# Patient Record
Sex: Male | Born: 1960 | Race: Black or African American | Hispanic: No | Marital: Single | Smoking: Current every day smoker
Health system: Southern US, Community
[De-identification: ages and names within clinical notes are randomized; demographics above are authoritative.]

## PROBLEM LIST (undated history)

## (undated) DIAGNOSIS — J449 Chronic obstructive pulmonary disease, unspecified: Secondary | ICD-10-CM

## (undated) DIAGNOSIS — M109 Gout, unspecified: Secondary | ICD-10-CM

## (undated) DIAGNOSIS — E119 Type 2 diabetes mellitus without complications: Secondary | ICD-10-CM

## (undated) DIAGNOSIS — N189 Chronic kidney disease, unspecified: Secondary | ICD-10-CM

## (undated) DIAGNOSIS — I10 Essential (primary) hypertension: Secondary | ICD-10-CM

## (undated) DIAGNOSIS — I2510 Atherosclerotic heart disease of native coronary artery without angina pectoris: Secondary | ICD-10-CM

## (undated) DIAGNOSIS — K759 Inflammatory liver disease, unspecified: Secondary | ICD-10-CM

## (undated) HISTORY — PX: ABOVE KNEE LEG AMPUTATION: SUR20

## (undated) HISTORY — PX: VASCULAR SURGERY: SHX849

## (undated) HISTORY — DX: Chronic kidney disease, unspecified: N18.9

## (undated) HISTORY — PX: SKIN GRAFT: SHX250

## (undated) HISTORY — DX: Inflammatory liver disease, unspecified: K75.9

---

## 2007-10-11 ENCOUNTER — Emergency Department: Payer: Self-pay | Admitting: Emergency Medicine

## 2007-11-24 ENCOUNTER — Emergency Department: Payer: Self-pay | Admitting: Emergency Medicine

## 2008-11-20 IMAGING — CR DG CHEST 2V
1 series · 2 of 2 positions shown · non-contrast
Comparison: none

REASON FOR EXAM: cough, fever, sob
COMMENTS:   LMP: male

[Series 1: view not recorded · 0.17mm/px · 2 of 2 slices shown]
[im 1/2]
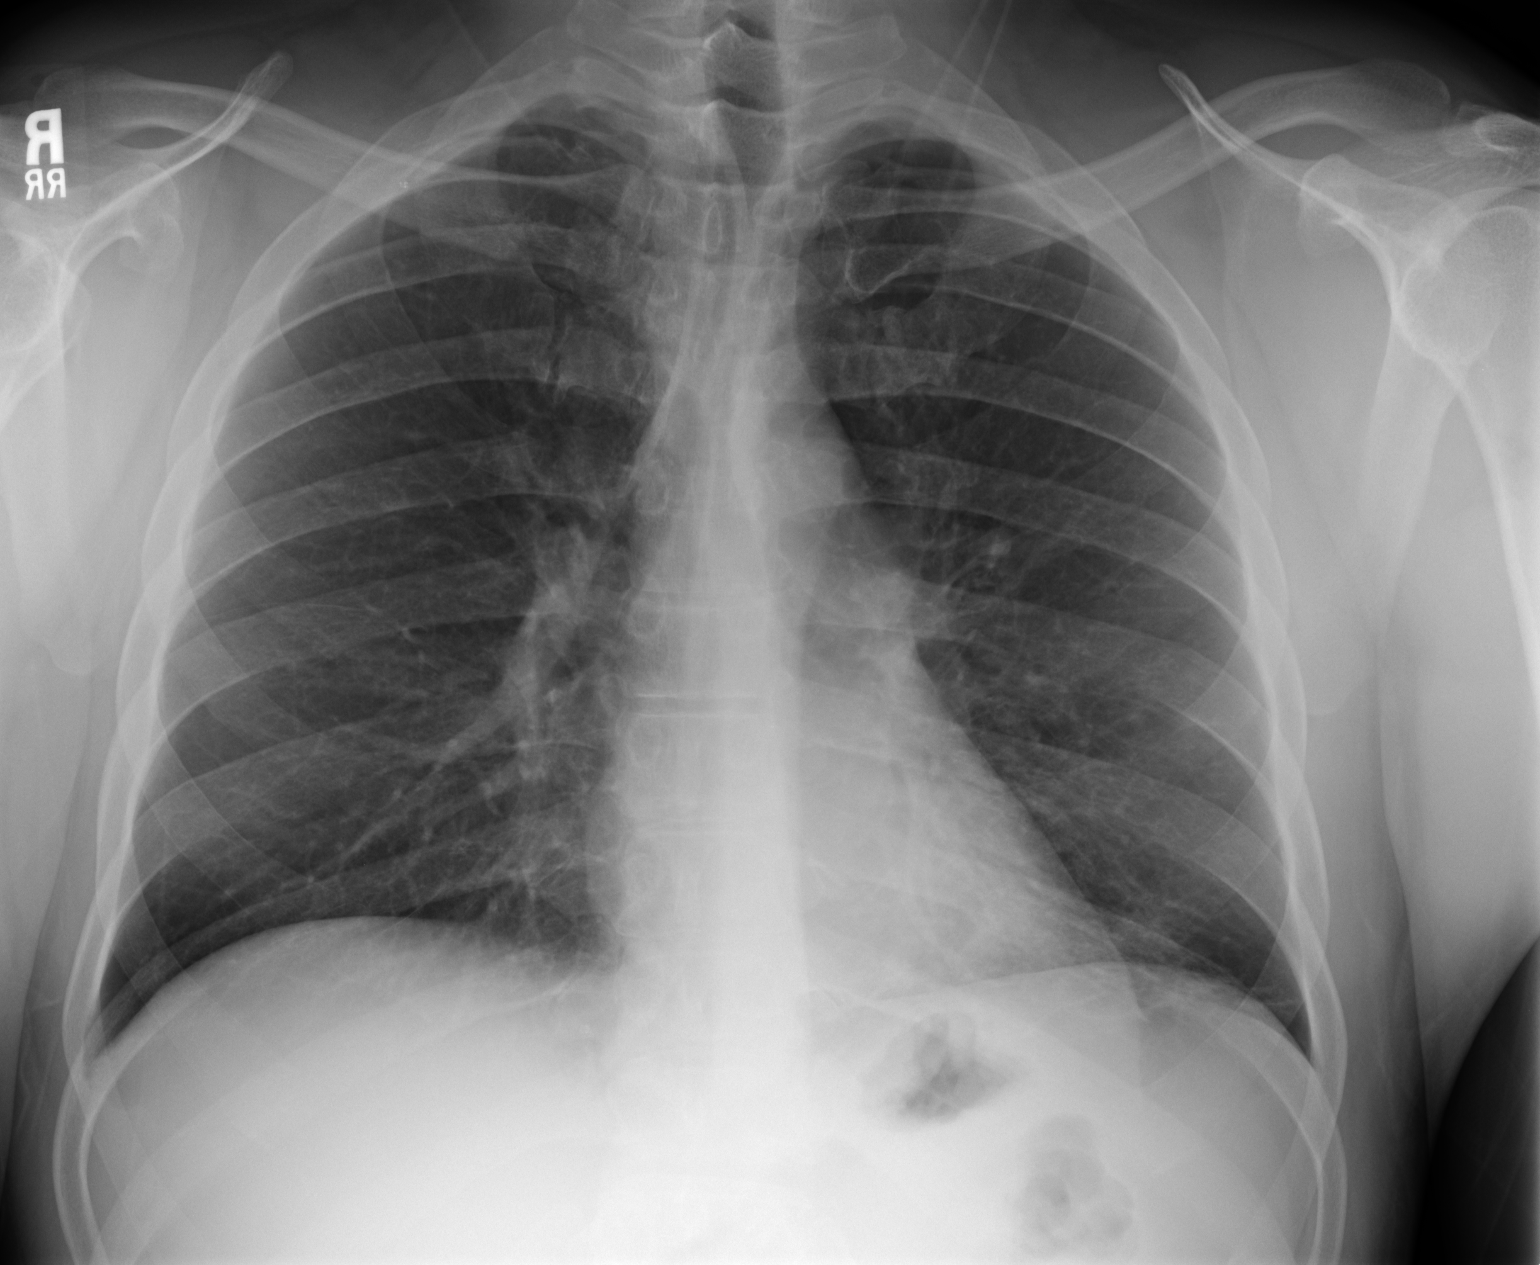
[im 2/2]
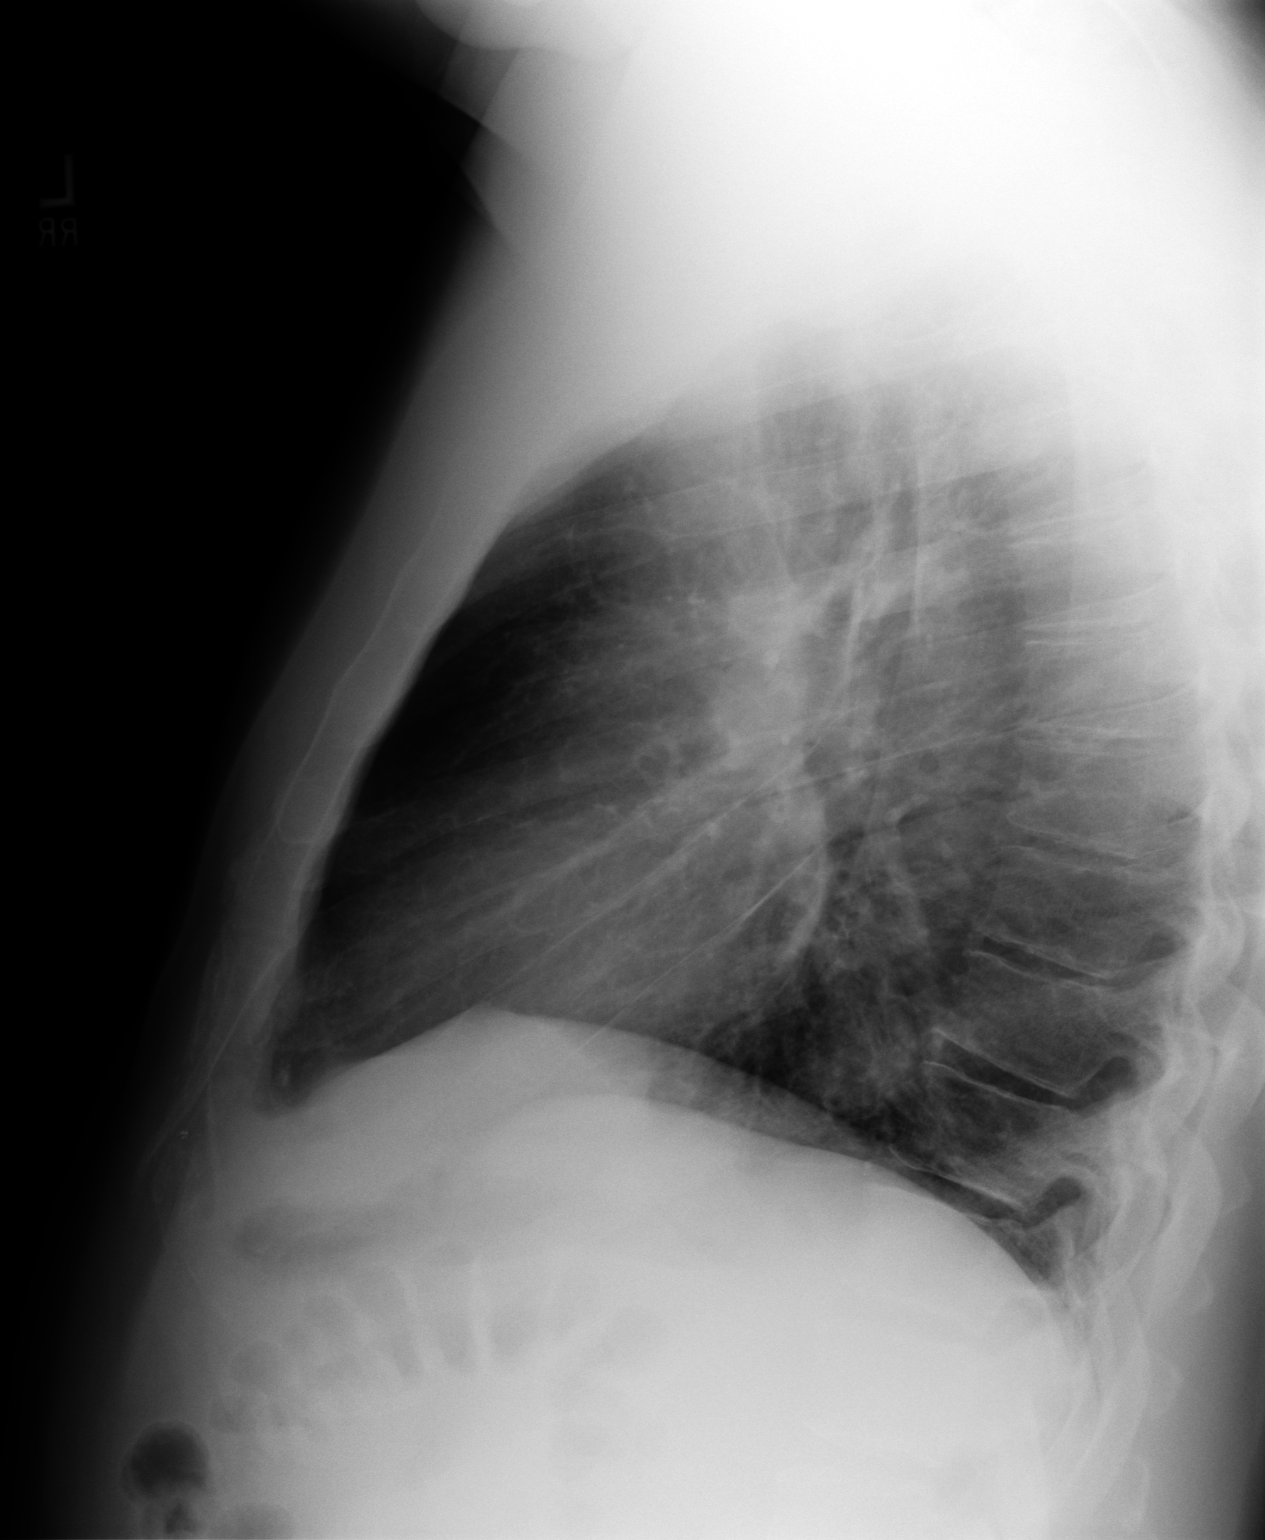

[2 of 2 positions shown; findings below may reference images not displayed]

PROCEDURE:     DXR - DXR CHEST PA (OR AP) AND LATERAL  - October 11, 2007  [DATE]

RESULT:     The patient is complaining of cough and fever and chest
discomfort.

The lungs are adequately inflated. I see no focal infiltrate or evidence of
a pleural effusion. There is no pneumothorax. The heart and mediastinal
structures are within limits of normal. The perihilar lung markings are
minimally prominent. I do not see evidence of a displaced rib fracture.
IMPRESSION: I cannot exclude an element of acute bronchitis but I do not see evidence of
pneumonia. Followup films are recommended if the patient has persistent
symptoms.

## 2012-10-14 ENCOUNTER — Emergency Department: Payer: Self-pay | Admitting: Emergency Medicine

## 2012-10-14 LAB — BASIC METABOLIC PANEL
BUN: 10 mg/dL (ref 7–18)
Chloride: 99 mmol/L (ref 98–107)
Co2: 22 mmol/L (ref 21–32)
Creatinine: 1.47 mg/dL — ABNORMAL HIGH (ref 0.60–1.30)
EGFR (Non-African Amer.): 55 — ABNORMAL LOW
Glucose: 102 mg/dL — ABNORMAL HIGH (ref 65–99)
Potassium: 3.3 mmol/L — ABNORMAL LOW (ref 3.5–5.1)
Sodium: 135 mmol/L — ABNORMAL LOW (ref 136–145)

## 2012-10-14 LAB — CK TOTAL AND CKMB (NOT AT ARMC): CK-MB: 0.6 ng/mL (ref 0.5–3.6)

## 2012-10-14 LAB — CBC
HCT: 48.8 % (ref 40.0–52.0)
HGB: 17.1 g/dL (ref 13.0–18.0)
MCHC: 35 g/dL (ref 32.0–36.0)
RBC: 5.58 10*6/uL (ref 4.40–5.90)
RDW: 13.6 % (ref 11.5–14.5)
WBC: 12 10*3/uL — ABNORMAL HIGH (ref 3.8–10.6)

## 2012-10-14 LAB — RAPID INFLUENZA A&B ANTIGENS

## 2012-10-14 LAB — TROPONIN I: Troponin-I: <0.02 ng/mL

## 2013-05-06 ENCOUNTER — Emergency Department: Payer: Self-pay | Admitting: Emergency Medicine

## 2013-05-06 LAB — CBC
HCT: 40 % (ref 40.0–52.0)
HGB: 13.9 g/dL (ref 13.0–18.0)
MCH: 29.2 pg (ref 26.0–34.0)
MCHC: 34.6 g/dL (ref 32.0–36.0)

## 2013-05-06 LAB — COMPREHENSIVE METABOLIC PANEL
Albumin: 3.2 g/dL — ABNORMAL LOW (ref 3.4–5.0)
Alkaline Phosphatase: 160 U/L — ABNORMAL HIGH (ref 50–136)
BUN: 14 mg/dL (ref 7–18)
Bilirubin,Total: 1.1 mg/dL — ABNORMAL HIGH (ref 0.2–1.0)
Calcium, Total: 9.8 mg/dL (ref 8.5–10.1)
Chloride: 98 mmol/L (ref 98–107)
EGFR (African American): 56 — ABNORMAL LOW
EGFR (Non-African Amer.): 48 — ABNORMAL LOW
Glucose: 98 mg/dL (ref 65–99)
Osmolality: 265 (ref 275–301)
SGOT(AST): 26 U/L (ref 15–37)

## 2013-05-06 LAB — URINALYSIS, COMPLETE
Bacteria: NONE SEEN
Blood: NEGATIVE
Glucose,UR: NEGATIVE mg/dL (ref 0–75)
Leukocyte Esterase: NEGATIVE
RBC,UR: <1 /HPF (ref 0–5)
Specific Gravity: 1.021 (ref 1.003–1.030)
Squamous Epithelial: NONE SEEN

## 2014-09-24 ENCOUNTER — Emergency Department: Payer: Self-pay | Admitting: Emergency Medicine

## 2014-09-24 LAB — URINALYSIS, COMPLETE
BILIRUBIN, UR: NEGATIVE
Bacteria: NONE SEEN
Blood: NEGATIVE
GLUCOSE, UR: NEGATIVE mg/dL (ref 0–75)
Hyaline Cast: 6
KETONE: NEGATIVE
LEUKOCYTE ESTERASE: NEGATIVE
Nitrite: NEGATIVE
Ph: 5 (ref 4.5–8.0)
Protein: NEGATIVE
RBC,UR: 1 /HPF (ref 0–5)
Specific Gravity: 1.006 (ref 1.003–1.030)

## 2014-09-24 LAB — COMPREHENSIVE METABOLIC PANEL
ALK PHOS: 95 U/L
ALT: 88 U/L — AB
Albumin: 3.8 g/dL (ref 3.4–5.0)
Anion Gap: 10 (ref 7–16)
BILIRUBIN TOTAL: 0.4 mg/dL (ref 0.2–1.0)
BUN: 12 mg/dL (ref 7–18)
CO2: 28 mmol/L (ref 21–32)
Calcium, Total: 8.5 mg/dL (ref 8.5–10.1)
Chloride: 104 mmol/L (ref 98–107)
Creatinine: 1.39 mg/dL — ABNORMAL HIGH (ref 0.60–1.30)
EGFR (Non-African Amer.): 57 — ABNORMAL LOW
Glucose: 154 mg/dL — ABNORMAL HIGH (ref 65–99)
OSMOLALITY: 286 (ref 275–301)
Potassium: 3.9 mmol/L (ref 3.5–5.1)
SGOT(AST): 68 U/L — ABNORMAL HIGH (ref 15–37)
Sodium: 142 mmol/L (ref 136–145)
Total Protein: 8 g/dL (ref 6.4–8.2)

## 2014-09-24 LAB — CBC WITH DIFFERENTIAL/PLATELET
BASOS ABS: 2 %
Comment - H1-Com2: NORMAL
EOS PCT: 9 %
HCT: 49.4 % (ref 40.0–52.0)
HGB: 16.7 g/dL (ref 13.0–18.0)
LYMPHS PCT: 54 %
MCH: 30.1 pg (ref 26.0–34.0)
MCHC: 33.8 g/dL (ref 32.0–36.0)
MCV: 89 fL (ref 80–100)
MONOS PCT: 5 %
PLATELETS: 198 10*3/uL (ref 150–440)
RBC: 5.54 10*6/uL (ref 4.40–5.90)
RDW: 14.3 % (ref 11.5–14.5)
Segmented Neutrophils: 17 %
VARIANT LYMPHOCYTE - H1-RLYMPH: 13 %
WBC: 8 10*3/uL (ref 3.8–10.6)

## 2014-09-24 LAB — LIPASE, BLOOD: LIPASE: 415 U/L — AB (ref 73–393)

## 2015-06-27 ENCOUNTER — Ambulatory Visit: Payer: Self-pay

## 2015-07-13 ENCOUNTER — Ambulatory Visit: Payer: Self-pay

## 2015-07-25 ENCOUNTER — Ambulatory Visit: Payer: Self-pay

## 2015-07-27 ENCOUNTER — Ambulatory Visit: Payer: Self-pay

## 2015-07-27 DIAGNOSIS — N189 Chronic kidney disease, unspecified: Secondary | ICD-10-CM

## 2015-07-27 HISTORY — DX: Chronic kidney disease, unspecified: N18.9

## 2015-07-27 LAB — HEPATIC FUNCTION PANEL
ALT: 46 U/L — AB (ref 10–40)
AST: 39 U/L (ref 14–40)
Alkaline Phosphatase: 93 U/L (ref 25–125)
BILIRUBIN, TOTAL: 0.8 mg/dL

## 2015-07-27 LAB — BASIC METABOLIC PANEL
BUN: 17 mg/dL (ref 4–21)
Creatinine: 1.4 mg/dL — AB (ref 0.6–1.3)
Glucose: 96 mg/dL
SODIUM: 137 mmol/L (ref 137–147)

## 2015-08-10 ENCOUNTER — Other Ambulatory Visit: Payer: Self-pay

## 2015-11-02 ENCOUNTER — Ambulatory Visit: Payer: Self-pay

## 2015-11-16 ENCOUNTER — Ambulatory Visit: Payer: Self-pay

## 2015-11-28 ENCOUNTER — Ambulatory Visit: Payer: Self-pay

## 2015-11-28 DIAGNOSIS — R768 Other specified abnormal immunological findings in serum: Secondary | ICD-10-CM | POA: Insufficient documentation

## 2015-11-28 DIAGNOSIS — K759 Inflammatory liver disease, unspecified: Secondary | ICD-10-CM

## 2015-11-28 DIAGNOSIS — R7303 Prediabetes: Secondary | ICD-10-CM | POA: Insufficient documentation

## 2015-11-28 HISTORY — DX: Inflammatory liver disease, unspecified: K75.9

## 2015-11-30 DIAGNOSIS — R7303 Prediabetes: Secondary | ICD-10-CM

## 2015-11-30 DIAGNOSIS — R768 Other specified abnormal immunological findings in serum: Secondary | ICD-10-CM

## 2015-12-05 ENCOUNTER — Other Ambulatory Visit: Payer: Self-pay

## 2015-12-12 ENCOUNTER — Other Ambulatory Visit: Payer: Self-pay

## 2015-12-26 ENCOUNTER — Ambulatory Visit: Payer: Self-pay

## 2018-12-07 ENCOUNTER — Other Ambulatory Visit: Payer: Self-pay

## 2018-12-07 ENCOUNTER — Encounter: Payer: Self-pay | Admitting: Emergency Medicine

## 2018-12-07 DIAGNOSIS — F17210 Nicotine dependence, cigarettes, uncomplicated: Secondary | ICD-10-CM | POA: Insufficient documentation

## 2018-12-07 DIAGNOSIS — R1084 Generalized abdominal pain: Secondary | ICD-10-CM | POA: Insufficient documentation

## 2018-12-07 DIAGNOSIS — R140 Abdominal distension (gaseous): Secondary | ICD-10-CM | POA: Insufficient documentation

## 2018-12-07 DIAGNOSIS — K5900 Constipation, unspecified: Secondary | ICD-10-CM | POA: Insufficient documentation

## 2018-12-07 LAB — COMPREHENSIVE METABOLIC PANEL
ALT: 86 U/L — ABNORMAL HIGH (ref 0–44)
ANION GAP: 11 (ref 5–15)
AST: 79 U/L — ABNORMAL HIGH (ref 15–41)
Albumin: 4 g/dL (ref 3.5–5.0)
Alkaline Phosphatase: 72 U/L (ref 38–126)
BUN: 12 mg/dL (ref 6–20)
CHLORIDE: 101 mmol/L (ref 98–111)
CO2: 21 mmol/L — ABNORMAL LOW (ref 22–32)
Calcium: 8.5 mg/dL — ABNORMAL LOW (ref 8.9–10.3)
Creatinine, Ser: 1.14 mg/dL (ref 0.61–1.24)
GFR calc Af Amer: >60 mL/min (ref >60)
GFR calc non Af Amer: >60 mL/min (ref >60)
Glucose, Bld: 235 mg/dL — ABNORMAL HIGH (ref 70–99)
Potassium: 3.6 mmol/L (ref 3.5–5.1)
Sodium: 133 mmol/L — ABNORMAL LOW (ref 135–145)
Total Bilirubin: 0.9 mg/dL (ref 0.3–1.2)
Total Protein: 7.2 g/dL (ref 6.5–8.1)

## 2018-12-07 LAB — CBC
HCT: 46.6 % (ref 39.0–52.0)
Hemoglobin: 16.2 g/dL (ref 13.0–17.0)
MCH: 30.5 pg (ref 26.0–34.0)
MCHC: 34.8 g/dL (ref 30.0–36.0)
MCV: 87.6 fL (ref 80.0–100.0)
Platelets: 167 10*3/uL (ref 150–400)
RBC: 5.32 MIL/uL (ref 4.22–5.81)
RDW: 13 % (ref 11.5–15.5)
WBC: 14.2 10*3/uL — ABNORMAL HIGH (ref 4.0–10.5)
nRBC: 0 % (ref 0.0–0.2)

## 2018-12-07 LAB — LIPASE, BLOOD: LIPASE: 31 U/L (ref 11–51)

## 2018-12-07 MED ORDER — SODIUM CHLORIDE 0.9% FLUSH: 3.0000 mL | Freq: Once | INTRAVENOUS | Status: DC

## 2018-12-07 NOTE — ED Triage Notes (Addendum)
Pt presents to ED via [facility] with c/o abdominal pain x 3 months. Per EMS pt found out approx 1 month ago that patient had Hep C. Per EMS pt with abdominal distention, VSS. Pt denies N/V/D.

## 2018-12-07 NOTE — ED Notes (Signed)
Pt states he does not want his medical hx discussed in front of family.

## 2018-12-08 ENCOUNTER — Encounter: Payer: Self-pay | Admitting: Radiology

## 2018-12-08 ENCOUNTER — Emergency Department: Payer: Self-pay

## 2018-12-08 ENCOUNTER — Emergency Department
Admission: EM | Admit: 2018-12-08 | Discharge: 2018-12-08 | Disposition: A | Discharge status: Home / Self Care | Payer: Self-pay | Attending: Emergency Medicine | Admitting: Emergency Medicine

## 2018-12-08 DIAGNOSIS — R1084 Generalized abdominal pain: Secondary | ICD-10-CM

## 2018-12-08 DIAGNOSIS — K5900 Constipation, unspecified: Secondary | ICD-10-CM

## 2018-12-08 LAB — ETHANOL: ALCOHOL ETHYL (B): 168 mg/dL — AB (ref <10)

## 2018-12-08 LAB — URINALYSIS, COMPLETE (UACMP) WITH MICROSCOPIC
Bacteria, UA: NONE SEEN
Bilirubin Urine: NEGATIVE
Glucose, UA: NEGATIVE mg/dL
Hgb urine dipstick: NEGATIVE
KETONES UR: NEGATIVE mg/dL
LEUKOCYTE UA: NEGATIVE
Nitrite: NEGATIVE
Protein, ur: NEGATIVE mg/dL
Specific Gravity, Urine: 1.002 — ABNORMAL LOW (ref 1.005–1.030)
Squamous Epithelial / HPF: NONE SEEN (ref 0–5)
pH: 5 (ref 5.0–8.0)

## 2018-12-08 MED ORDER — LACTULOSE 10 GM/15ML PO SOLN: 20.0000 g | Freq: Every day | ORAL | 0 refills | Status: DC | PRN

## 2018-12-08 MED ORDER — FENTANYL CITRATE (PF) 100 MCG/2ML IJ SOLN
50.0000 ug | Freq: Once | INTRAMUSCULAR | Status: AC
  Administered 2018-12-08: 50 ug via INTRAVENOUS
  Filled 2018-12-08: qty 2

## 2018-12-08 MED ORDER — IOHEXOL 300 MG/ML  SOLN
100.0000 mL | Freq: Once | INTRAMUSCULAR | Status: AC | PRN
  Administered 2018-12-08: 100 mL via INTRAVENOUS

## 2018-12-08 MED ORDER — SODIUM CHLORIDE 0.9 % IV BOLUS
500.0000 mL | Freq: Once | INTRAVENOUS | Status: AC
  Administered 2018-12-08: 500 mL via INTRAVENOUS

## 2018-12-08 MED ORDER — FAMOTIDINE IN NACL 20-0.9 MG/50ML-% IV SOLN
20.0000 mg | Freq: Once | INTRAVENOUS | Status: AC
  Administered 2018-12-08: 20 mg via INTRAVENOUS
  Filled 2018-12-08: qty 50

## 2018-12-08 MED ORDER — IOPAMIDOL (ISOVUE-300) INJECTION 61%
30.0000 mL | Freq: Once | INTRAVENOUS | Status: AC | PRN
  Administered 2018-12-08: 30 mL via ORAL

## 2018-12-08 MED ORDER — ONDANSETRON HCL 4 MG/2ML IJ SOLN
4.0000 mg | Freq: Once | INTRAMUSCULAR | Status: AC
  Administered 2018-12-08: 4 mg via INTRAVENOUS
  Filled 2018-12-08: qty 2
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
## 2018-12-08 NOTE — Discharge Instructions (Addendum)
1. You may take Lactulose as needed for bowel movements. °2. Return to the ER for worsening symptoms, persistent vomiting, difficulty breathing or other concerns. °

## 2018-12-09 LAB — HEPATITIS PANEL, ACUTE
HCV Ab: >11 s/co ratio — ABNORMAL HIGH (ref 0.0–0.9)
Hep A IgM: NEGATIVE
Hep B C IgM: NEGATIVE
Hepatitis B Surface Ag: NEGATIVE

## 2018-12-17 ENCOUNTER — Telehealth: Payer: Self-pay | Admitting: Emergency Medicine

## 2018-12-17 NOTE — Telephone Encounter (Signed)
Patient called for hepatitis testing results.  I gave him results.  Explained that we had noted that he had previously positive for hep c.  He says he does recall being told, but says  He just wanted to have it checked again.  I explained [place] [facility] and gave him the number to call.  He does not have insurance, but they may be able to help him get treatment for the hep c.

## 2019-08-09 ENCOUNTER — Other Ambulatory Visit: Payer: Self-pay

## 2019-08-09 ENCOUNTER — Other Ambulatory Visit: Payer: Self-pay | Admitting: *Deleted

## 2019-08-09 ENCOUNTER — Other Ambulatory Visit: Payer: Self-pay | Admitting: Urology

## 2019-08-09 DIAGNOSIS — Z125 Encounter for screening for malignant neoplasm of prostate: Secondary | ICD-10-CM

## 2019-08-09 NOTE — Progress Notes (Signed)
Patient: Vincent [name]           Date of Birth: November 24, 1960           MRN: [id number] Visit Date: 08/09/2019 PCP: Patient, No Pcp Per  Prostate Cancer Screening Date of last physical exam: (P) (5 years) Date of last rectal exam: (P) (10 years ago) Have you ever had any of the following?: (P) None Have you ever had or been told you have an allergy to latex products?: (P) No Are you currently taking any natural prostate preparations?: (P) No Are you currently experiencing any urinary symptoms?: (P) Yes  Prostate Exam Exam not completed.  Patient's History Patient Active Problem List   Diagnosis Date Noted  . Hepatitis C antibody test positive 11/28/2015  . Prediabetes 11/28/2015   Past Medical History:  Diagnosis Date  . Chronic kidney disease 07/27/15   Noted at [facility] as Stage 2  . Hepatitis 11/28/15   positive for Hep C virus antibody    Family History  Problem Relation Age of Onset  . Diabetes Mother   . Heart Problems Brother   . Anemia Son     Social History   Occupational History  . Not on file  Tobacco Use  . Smoking status: Current Every Day Smoker    Packs/day: 0.50    Types: Cigarettes  . Smokeless tobacco: Never Used  Substance and Sexual Activity  . Alcohol use: Yes    Comment: few drinks per week.  . Drug use: Not on file    Comment: Former user  . Sexual activity: Not on file

## 2019-08-11 LAB — PSA: Prostate Specific Ag, Serum: 2.1 ng/mL (ref 0.0–4.0)

## 2019-08-18 ENCOUNTER — Telehealth (HOSPITAL_COMMUNITY): Payer: Self-pay | Admitting: *Deleted

## 2019-08-18 NOTE — Telephone Encounter (Signed)
Called patient and gave PSA result. Let him know it was 2.1 and normal for his age is below 3. Patient verbalized understanding.

## 2020-02-08 ENCOUNTER — Other Ambulatory Visit: Payer: Self-pay

## 2020-02-08 ENCOUNTER — Encounter: Payer: Self-pay | Admitting: Emergency Medicine

## 2020-02-08 ENCOUNTER — Emergency Department
Admission: EM | Admit: 2020-02-08 | Discharge: 2020-02-08 | Disposition: A | Discharge status: Home / Self Care | Payer: Self-pay | Attending: Emergency Medicine | Admitting: Emergency Medicine

## 2020-02-08 DIAGNOSIS — K047 Periapical abscess without sinus: Secondary | ICD-10-CM | POA: Insufficient documentation

## 2020-02-08 DIAGNOSIS — F17210 Nicotine dependence, cigarettes, uncomplicated: Secondary | ICD-10-CM | POA: Insufficient documentation

## 2020-02-08 DIAGNOSIS — K029 Dental caries, unspecified: Secondary | ICD-10-CM | POA: Insufficient documentation

## 2020-02-08 DIAGNOSIS — N182 Chronic kidney disease, stage 2 (mild): Secondary | ICD-10-CM | POA: Insufficient documentation

## 2020-02-08 DIAGNOSIS — Z79899 Other long term (current) drug therapy: Secondary | ICD-10-CM | POA: Insufficient documentation

## 2020-02-08 MED ORDER — AMOXICILLIN 500 MG PO CAPS: 500.0000 mg | ORAL_CAPSULE | Freq: Three times a day (TID) | ORAL | 0 refills | Status: DC

## 2020-02-08 MED ORDER — AMOXICILLIN 500 MG PO CAPS
500.0000 mg | ORAL_CAPSULE | Freq: Once | ORAL | Status: AC
  Administered 2020-02-08: 17:00:00 500 mg via ORAL
  Filled 2020-02-08: qty 1

## 2020-02-08 NOTE — Discharge Instructions (Signed)
OPTIONS FOR DENTAL FOLLOW UP CARE ° °[place] [facility] and [facility] - Local Safety Net Dental Clinics °http://www.ncdhhs.gov/dph/oralhealth/services/safetynetclinics.htm °  °[facility] (336-562-3123) ° °[facility] (919-933-9087) ° °[facility] ([phone] ext [phone]) ° °[facility] ([phone]) ° °[facility] ([phone]) °This clinic caters to the indigent population and is on a lottery system. °Location: °[facility], [address], [place] °Clinic Hours: °Wednesdays from 6pm - 9pm, patients seen by a lottery system. °For dates, call or go to www.med.[facility].edu/[facility]/patients/Dental-[facility] °Services: °Cleanings, fillings and simple extractions. °Payment Options: °DENTAL WORK IS FREE OF CHARGE. Bring proof of income or support. °Best way to get seen: °Arrive at 5:15 pm - this is a lottery, NOT first come/first serve, so arriving earlier will not increase your chances of being seen. °  °  °[facility] °[phone] °Select option 1 for emergencies °  °Location: °[facility], [address], [place] °Clinic Hours: °No walk-ins accepted - call the day before to schedule an appointment. °Check in times are 9:30 am and 1:30 pm. °Services: °Simple extractions, temporary fillings, pulpectomy/pulp debridement, uncomplicated abscess drainage. °Payment Options: °PAYMENT IS DUE AT THE TIME OF SERVICE.  Fee is usually $100-200, additional surgical procedures (e.g. abscess drainage) may be extra. °Cash, checks, Visa/MasterCard accepted.  Can file Medicaid if patient is covered for dental - patient should call case worker to check. °No discount for [facility] patients. °Best way to get seen: °MUST call the day before and get onto the schedule. Can usually be seen the next 1-2 days. No walk-ins accepted. °  °  °[facility] °[phone] °   °Location: °[facility], [address], [place] °Clinic Hours: °M, W, Th, F 8am or 1:30pm, Tues 9a or 1:30 - first come/first served. °Services: °Simple extractions, temporary fillings, uncomplicated abscess drainage.  You do not need to be an [place] resident. °Payment Options: °PAYMENT IS DUE AT THE TIME OF SERVICE. °Dental insurance, otherwise sliding scale - bring proof of income or support. °Depending on income and treatment needed, cost is usually $50-200. °Best way to get seen: °Arrive early as it is first come/first served. °  °  °[facility] °[phone] °  °Location: °[address] °Clinic Hours: °Mon-Thu 8a-5p °Services: °Most basic dental services including extractions and fillings. °Payment Options: °PAYMENT IS DUE AT THE TIME OF SERVICE. °Sliding scale, up to 50% off - bring proof if income or support. °Medicaid with dental option accepted. °Best way to get seen: °Call to schedule an appointment, can usually be seen within 2 weeks OR they will try to see walk-ins - show up at 8a or 2p (you may have to wait). °  °  °[facility] °919-245-2435 °[place] RESIDENTS ONLY °  °Location: °[facility], [address], [place], [place] [address] °Clinic Hours: By appointment only. °Monday - Thursday 8am-5pm, Friday 8am-12pm °Services: Cleanings, fillings, extractions. °Payment Options: °PAYMENT IS DUE AT THE TIME OF SERVICE. °Cash, Visa or MasterCard. Sliding scale - $30 minimum per service. °Best way to get seen: °Come in to office, complete packet and make an appointment - need proof of income °or support monies for each household member and proof of [place] residence. °Usually takes about a month to get in. °  °  °[facility] °[phone] °  °Location: °[address]   [place] °Clinic Hours: Walk-in Urgent Care Dental Services are offered Monday-Friday  mornings only. °The numbers of emergencies accepted daily is limited to the number of °providers available. °Maximum 15 - Mondays, Wednesdays & Thursdays °Maximum 10 - Tuesdays & Fridays °Services: °You do not need to be a [place] resident to be seen for a dental emergency. °Emergencies are defined as pain, swelling, abnormal bleeding, or dental trauma. Walkins will receive x-rays if needed. °NOTE: Dental cleaning is not an emergency. °Payment Options: °PAYMENT IS DUE AT THE TIME OF SERVICE. °Minimum co-pay is $40.00 for uninsured patients. °Minimum co-pay is $3.00 for Medicaid with dental coverage. °Dental Insurance is accepted and must be presented at time of visit. °Medicare does not cover dental. °Forms of payment: Cash, credit card, checks. °Best way to get seen: °If not previously registered with the clinic, walk-in dental registration begins at 7:15 am and is on a first come/first serve basis. °If previously registered with the clinic, call to make an appointment. °  °  °[facility] °919-776-4359 °[place] RESIDENTS ONLY °  °Location: °[address], [place], [place] °Clinic Hours: °Mon-Thu 10a-2p °Services: Extractions only! °Payment Options: °FREE (donations accepted) - bring proof of income or support °Best way to get seen: °Call and schedule an appointment OR come at 8am on the 1st Monday of every month (except for holidays) when it is first come/first served. °  °  °[facility] °[phone] °  °Location: °[address], [place] °Clinic Hours: °Friday mornings °Services, Payment Options, Best way to get seen: °Call for info °

## 2020-02-08 NOTE — ED Triage Notes (Signed)
Pt here for left upper dental pain with some swelling.    Does not have dentist.  Unlabored. No fever. Swelling noted to left side of face. Pt just wants abx so that he can get upstairs to mother who is in ICU about to be intubated.
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
## 2020-08-26 ENCOUNTER — Other Ambulatory Visit: Payer: Self-pay

## 2020-08-26 ENCOUNTER — Encounter: Payer: Self-pay | Admitting: Emergency Medicine

## 2020-08-26 ENCOUNTER — Emergency Department
Admission: EM | Admit: 2020-08-26 | Discharge: 2020-08-26 | Disposition: A | Discharge status: Home / Self Care | Payer: Self-pay | Attending: Emergency Medicine | Admitting: Emergency Medicine

## 2020-08-26 DIAGNOSIS — F17210 Nicotine dependence, cigarettes, uncomplicated: Secondary | ICD-10-CM | POA: Insufficient documentation

## 2020-08-26 DIAGNOSIS — N182 Chronic kidney disease, stage 2 (mild): Secondary | ICD-10-CM | POA: Insufficient documentation

## 2020-08-26 DIAGNOSIS — M79671 Pain in right foot: Secondary | ICD-10-CM | POA: Insufficient documentation

## 2020-08-26 MED ORDER — COLCHICINE 0.6 MG PO TABS: 0.6000 mg | ORAL_TABLET | Freq: Two times a day (BID) | ORAL | 2 refills | Status: DC

## 2020-08-26 NOTE — ED Triage Notes (Signed)
Pt c/o R foot pain and swelling x 2 weeks. Pt denies injury at this time. Pt denies worsening pain with movement at this time.

## 2020-08-26 NOTE — Discharge Instructions (Signed)
Take colchicine twice daily until gout flare resolves. If symptoms do not improve, please return to the emergency department for reevaluation
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
## 2021-03-09 ENCOUNTER — Observation Stay
Admission: EM | Admit: 2021-03-09 | Discharge: 2021-03-10 | Disposition: A | Discharge status: Home / Self Care | Payer: Self-pay | Attending: Internal Medicine | Admitting: Internal Medicine

## 2021-03-09 ENCOUNTER — Emergency Department: Payer: Self-pay

## 2021-03-09 ENCOUNTER — Encounter: Payer: Self-pay | Admitting: Emergency Medicine

## 2021-03-09 ENCOUNTER — Other Ambulatory Visit: Payer: Self-pay

## 2021-03-09 ENCOUNTER — Observation Stay: Payer: Self-pay

## 2021-03-09 DIAGNOSIS — Z789 Other specified health status: Secondary | ICD-10-CM

## 2021-03-09 DIAGNOSIS — N179 Acute kidney failure, unspecified: Secondary | ICD-10-CM | POA: Diagnosis present

## 2021-03-09 DIAGNOSIS — Z720 Tobacco use: Secondary | ICD-10-CM | POA: Diagnosis present

## 2021-03-09 DIAGNOSIS — Z7289 Other problems related to lifestyle: Secondary | ICD-10-CM

## 2021-03-09 DIAGNOSIS — F1090 Alcohol use, unspecified, uncomplicated: Secondary | ICD-10-CM

## 2021-03-09 DIAGNOSIS — R7303 Prediabetes: Secondary | ICD-10-CM | POA: Diagnosis present

## 2021-03-09 DIAGNOSIS — R7989 Other specified abnormal findings of blood chemistry: Secondary | ICD-10-CM

## 2021-03-09 DIAGNOSIS — F17210 Nicotine dependence, cigarettes, uncomplicated: Secondary | ICD-10-CM | POA: Insufficient documentation

## 2021-03-09 DIAGNOSIS — I4891 Unspecified atrial fibrillation: Principal | ICD-10-CM | POA: Diagnosis present

## 2021-03-09 DIAGNOSIS — R0602 Shortness of breath: Secondary | ICD-10-CM | POA: Insufficient documentation

## 2021-03-09 DIAGNOSIS — R079 Chest pain, unspecified: Secondary | ICD-10-CM | POA: Diagnosis present

## 2021-03-09 DIAGNOSIS — Z79899 Other long term (current) drug therapy: Secondary | ICD-10-CM | POA: Insufficient documentation

## 2021-03-09 DIAGNOSIS — E871 Hypo-osmolality and hyponatremia: Secondary | ICD-10-CM | POA: Diagnosis present

## 2021-03-09 DIAGNOSIS — Z20822 Contact with and (suspected) exposure to covid-19: Secondary | ICD-10-CM | POA: Insufficient documentation

## 2021-03-09 DIAGNOSIS — N1831 Acute kidney failure, unspecified: Secondary | ICD-10-CM | POA: Diagnosis present

## 2021-03-09 DIAGNOSIS — F1099 Alcohol use, unspecified with unspecified alcohol-induced disorder: Secondary | ICD-10-CM | POA: Insufficient documentation

## 2021-03-09 LAB — COMPREHENSIVE METABOLIC PANEL
ALT: 39 U/L (ref 0–44)
AST: 48 U/L — ABNORMAL HIGH (ref 15–41)
Albumin: 3.4 g/dL — ABNORMAL LOW (ref 3.5–5.0)
Alkaline Phosphatase: 67 U/L (ref 38–126)
Anion gap: 13 (ref 5–15)
BUN: 11 mg/dL (ref 6–20)
CO2: 21 mmol/L — ABNORMAL LOW (ref 22–32)
Calcium: 8.4 mg/dL — ABNORMAL LOW (ref 8.9–10.3)
Chloride: 95 mmol/L — ABNORMAL LOW (ref 98–111)
Creatinine, Ser: 1.66 mg/dL — ABNORMAL HIGH (ref 0.61–1.24)
GFR, Estimated: 47 mL/min — ABNORMAL LOW (ref >60)
Glucose, Bld: 209 mg/dL — ABNORMAL HIGH (ref 70–99)
Potassium: 3.7 mmol/L (ref 3.5–5.1)
Sodium: 129 mmol/L — ABNORMAL LOW (ref 135–145)
Total Bilirubin: 0.7 mg/dL (ref 0.3–1.2)
Total Protein: 7.1 g/dL (ref 6.5–8.1)

## 2021-03-09 LAB — URINE DRUG SCREEN, QUALITATIVE (ARMC ONLY)
Amphetamines, Ur Screen: NOT DETECTED
Barbiturates, Ur Screen: NOT DETECTED
Benzodiazepine, Ur Scrn: NOT DETECTED
Cannabinoid 50 Ng, Ur ~~LOC~~: NOT DETECTED
Cocaine Metabolite,Ur ~~LOC~~: POSITIVE — AB
MDMA (Ecstasy)Ur Screen: NOT DETECTED
Methadone Scn, Ur: NOT DETECTED
Opiate, Ur Screen: NOT DETECTED
Phencyclidine (PCP) Ur S: NOT DETECTED
Tricyclic, Ur Screen: NOT DETECTED

## 2021-03-09 LAB — CBC WITH DIFFERENTIAL/PLATELET
Abs Immature Granulocytes: 0.05 10*3/uL (ref 0.00–0.07)
Basophils Absolute: 0.1 10*3/uL (ref 0.0–0.1)
Basophils Relative: 1 %
Eosinophils Absolute: 0 10*3/uL (ref 0.0–0.5)
Eosinophils Relative: 0 %
HCT: 39.4 % (ref 39.0–52.0)
Hemoglobin: 13.8 g/dL (ref 13.0–17.0)
Immature Granulocytes: 1 %
Lymphocytes Relative: 48 %
Lymphs Abs: 4.7 10*3/uL — ABNORMAL HIGH (ref 0.7–4.0)
MCH: 31.1 pg (ref 26.0–34.0)
MCHC: 35 g/dL (ref 30.0–36.0)
MCV: 88.7 fL (ref 80.0–100.0)
Monocytes Absolute: 0.8 10*3/uL (ref 0.1–1.0)
Monocytes Relative: 8 %
Neutro Abs: 4 10*3/uL (ref 1.7–7.7)
Neutrophils Relative %: 42 %
Platelets: 163 10*3/uL (ref 150–400)
RBC: 4.44 MIL/uL (ref 4.22–5.81)
RDW: 14 % (ref 11.5–15.5)
WBC: 9.7 10*3/uL (ref 4.0–10.5)
nRBC: 0 % (ref 0.0–0.2)

## 2021-03-09 LAB — TROPONIN I (HIGH SENSITIVITY)
Troponin I (High Sensitivity): 10 ng/L (ref <18)
Troponin I (High Sensitivity): 15 ng/L (ref <18)
Troponin I (High Sensitivity): 30 ng/L — ABNORMAL HIGH (ref <18)
Troponin I (High Sensitivity): 27 ng/L — ABNORMAL HIGH (ref <18)

## 2021-03-09 LAB — OSMOLALITY, URINE: Osmolality, Ur: 132 mOsm/kg — ABNORMAL LOW (ref 300–900)

## 2021-03-09 LAB — BASIC METABOLIC PANEL
Anion gap: 12 (ref 5–15)
Anion gap: 6 (ref 5–15)
BUN: 10 mg/dL (ref 6–20)
BUN: 12 mg/dL (ref 6–20)
CO2: 18 mmol/L — ABNORMAL LOW (ref 22–32)
CO2: 25 mmol/L (ref 22–32)
Calcium: 7.9 mg/dL — ABNORMAL LOW (ref 8.9–10.3)
Calcium: 8.3 mg/dL — ABNORMAL LOW (ref 8.9–10.3)
Chloride: 102 mmol/L (ref 98–111)
Chloride: 103 mmol/L (ref 98–111)
Creatinine, Ser: 1.34 mg/dL — ABNORMAL HIGH (ref 0.61–1.24)
Creatinine, Ser: 1.14 mg/dL (ref 0.61–1.24)
GFR, Estimated: >60 mL/min (ref >60)
GFR, Estimated: >60 mL/min (ref >60)
Glucose, Bld: 171 mg/dL — ABNORMAL HIGH (ref 70–99)
Glucose, Bld: 141 mg/dL — ABNORMAL HIGH (ref 70–99)
Potassium: 3.6 mmol/L (ref 3.5–5.1)
Potassium: 4.2 mmol/L (ref 3.5–5.1)
Sodium: 132 mmol/L — ABNORMAL LOW (ref 135–145)
Sodium: 134 mmol/L — ABNORMAL LOW (ref 135–145)

## 2021-03-09 LAB — AMMONIA: Ammonia: 21 umol/L (ref 9–35)

## 2021-03-09 LAB — HEMOGLOBIN A1C
Hgb A1c MFr Bld: 8.9 % — ABNORMAL HIGH (ref 4.8–5.6)
Mean Plasma Glucose: 208.73 mg/dL

## 2021-03-09 LAB — RESP PANEL BY RT-PCR (FLU A&B, COVID) ARPGX2
Influenza A by PCR: NEGATIVE
Influenza B by PCR: NEGATIVE
SARS Coronavirus 2 by RT PCR: NEGATIVE

## 2021-03-09 LAB — SODIUM, URINE, RANDOM: Sodium, Ur: 19 mmol/L

## 2021-03-09 LAB — MAGNESIUM: Magnesium: 1.3 mg/dL — ABNORMAL LOW (ref 1.7–2.4)

## 2021-03-09 LAB — GLUCOSE, CAPILLARY
Glucose-Capillary: 195 mg/dL — ABNORMAL HIGH (ref 70–99)
Glucose-Capillary: 161 mg/dL — ABNORMAL HIGH (ref 70–99)
Glucose-Capillary: 150 mg/dL — ABNORMAL HIGH (ref 70–99)

## 2021-03-09 LAB — D-DIMER, QUANTITATIVE: D-Dimer, Quant: 0.9 ug/mL-FEU — ABNORMAL HIGH (ref 0.00–0.50)

## 2021-03-09 LAB — HEPARIN LEVEL (UNFRACTIONATED): Heparin Unfractionated: 0.68 IU/mL (ref 0.30–0.70)

## 2021-03-09 LAB — OSMOLALITY: Osmolality: 306 mOsm/kg — ABNORMAL HIGH (ref 275–295)

## 2021-03-09 LAB — PHOSPHORUS: Phosphorus: 4.3 mg/dL (ref 2.5–4.6)

## 2021-03-09 LAB — BRAIN NATRIURETIC PEPTIDE: B Natriuretic Peptide: 27.3 pg/mL (ref 0.0–100.0)

## 2021-03-09 MED ORDER — ACETAMINOPHEN 325 MG PO TABS: 650.0000 mg | ORAL_TABLET | Freq: Four times a day (QID) | ORAL | Status: DC | PRN

## 2021-03-09 MED ORDER — TECHNETIUM TO 99M ALBUMIN AGGREGATED
4.0400 | Freq: Once | INTRAVENOUS | Status: AC | PRN
  Administered 2021-03-09: 4.04 via INTRAVENOUS

## 2021-03-09 MED ORDER — THIAMINE HCL 100 MG/ML IJ SOLN: 100.0000 mg | Freq: Every day | INTRAMUSCULAR | Status: DC

## 2021-03-09 MED ORDER — INSULIN ASPART 100 UNIT/ML IJ SOLN
0.0000 [IU] | Freq: Three times a day (TID) | INTRAMUSCULAR | Status: DC
  Administered 2021-03-09: 2 [IU] via SUBCUTANEOUS
  Administered 2021-03-10: 1 [IU] via SUBCUTANEOUS
  Administered 2021-03-10: 2 [IU] via SUBCUTANEOUS
  Filled 2021-03-09 (×2): qty 1

## 2021-03-09 MED ORDER — SODIUM CHLORIDE 0.9 % IV BOLUS
1000.0000 mL | Freq: Once | INTRAVENOUS | Status: AC
  Administered 2021-03-09: 1000 mL via INTRAVENOUS

## 2021-03-09 MED ORDER — MORPHINE SULFATE (PF) 2 MG/ML IV SOLN
2.0000 mg | INTRAVENOUS | Status: DC | PRN
  Administered 2021-03-09: 2 mg via INTRAVENOUS
  Filled 2021-03-09: qty 1

## 2021-03-09 MED ORDER — FOLIC ACID 1 MG PO TABS
1.0000 mg | ORAL_TABLET | Freq: Every day | ORAL | Status: DC
  Administered 2021-03-09 – 2021-03-10 (×2): 1 mg via ORAL
  Filled 2021-03-09 (×2): qty 1

## 2021-03-09 MED ORDER — MAGNESIUM SULFATE 4 GM/100ML IV SOLN
4.0000 g | Freq: Once | INTRAVENOUS | Status: AC
  Administered 2021-03-09: 4 g via INTRAVENOUS
  Filled 2021-03-09: qty 100

## 2021-03-09 MED ORDER — THIAMINE HCL 100 MG PO TABS
100.0000 mg | ORAL_TABLET | Freq: Every day | ORAL | Status: DC
  Administered 2021-03-09 – 2021-03-10 (×2): 100 mg via ORAL
  Filled 2021-03-09 (×2): qty 1

## 2021-03-09 MED ORDER — ADULT MULTIVITAMIN W/MINERALS CH
1.0000 | ORAL_TABLET | Freq: Every day | ORAL | Status: DC
  Administered 2021-03-09 – 2021-03-10 (×2): 1 via ORAL
  Filled 2021-03-09 (×2): qty 1

## 2021-03-09 MED ORDER — DILTIAZEM HCL 30 MG PO TABS
30.0000 mg | ORAL_TABLET | Freq: Two times a day (BID) | ORAL | Status: DC
  Administered 2021-03-09 – 2021-03-10 (×2): 30 mg via ORAL
  Filled 2021-03-09 (×2): qty 1

## 2021-03-09 MED ORDER — ASPIRIN 81 MG PO CHEW: 324.0000 mg | CHEWABLE_TABLET | Freq: Once | ORAL | Status: DC

## 2021-03-09 MED ORDER — ASPIRIN 81 MG PO CHEW
324.0000 mg | CHEWABLE_TABLET | Freq: Once | ORAL | Status: AC
  Administered 2021-03-09: 324 mg via ORAL
  Filled 2021-03-09: qty 4

## 2021-03-09 MED ORDER — LORAZEPAM 2 MG/ML IJ SOLN: 1.0000 mg | INTRAMUSCULAR | Status: DC | PRN

## 2021-03-09 MED ORDER — HEPARIN (PORCINE) 25000 UT/250ML-% IV SOLN
1450.0000 [IU]/h | INTRAVENOUS | Status: DC
  Administered 2021-03-09 – 2021-03-10 (×2): 1450 [IU]/h via INTRAVENOUS
  Filled 2021-03-09 (×2): qty 250

## 2021-03-09 MED ORDER — INSULIN ASPART 100 UNIT/ML IJ SOLN
0.0000 [IU] | Freq: Every day | INTRAMUSCULAR | Status: DC
  Filled 2021-03-09: qty 1

## 2021-03-09 MED ORDER — NITROGLYCERIN 0.4 MG SL SUBL
0.4000 mg | SUBLINGUAL_TABLET | SUBLINGUAL | Status: DC | PRN
  Administered 2021-03-09: 0.4 mg via SUBLINGUAL
  Filled 2021-03-09: qty 1

## 2021-03-09 MED ORDER — LORAZEPAM 2 MG/ML IJ SOLN: 0.0000 mg | Freq: Two times a day (BID) | INTRAMUSCULAR | Status: DC

## 2021-03-09 MED ORDER — ONDANSETRON HCL 4 MG/2ML IJ SOLN: 4.0000 mg | Freq: Three times a day (TID) | INTRAMUSCULAR | Status: DC | PRN

## 2021-03-09 MED ORDER — LORAZEPAM 1 MG PO TABS: 1.0000 mg | ORAL_TABLET | ORAL | Status: DC | PRN

## 2021-03-09 MED ORDER — NICOTINE 21 MG/24HR TD PT24
21.0000 mg | MEDICATED_PATCH | Freq: Every day | TRANSDERMAL | Status: DC
  Administered 2021-03-09 – 2021-03-10 (×2): 21 mg via TRANSDERMAL
  Filled 2021-03-09 (×2): qty 1

## 2021-03-09 MED ORDER — LORAZEPAM 2 MG/ML IJ SOLN: 0.0000 mg | Freq: Four times a day (QID) | INTRAMUSCULAR | Status: DC

## 2021-03-09 MED ORDER — ASPIRIN EC 81 MG PO TBEC
81.0000 mg | DELAYED_RELEASE_TABLET | Freq: Every day | ORAL | Status: DC
  Administered 2021-03-10: 81 mg via ORAL
  Filled 2021-03-09: qty 1

## 2021-03-09 MED ORDER — HEPARIN BOLUS VIA INFUSION
6000.0000 [IU] | Freq: Once | INTRAVENOUS | Status: AC
  Administered 2021-03-09: 6000 [IU] via INTRAVENOUS
  Filled 2021-03-09: qty 6000

## 2021-03-09 NOTE — ED Triage Notes (Signed)
Patient arrives from home today via [facility] for CP that started about 3 hours prior to arrival. Patient states the pain is in the center of chest and denies any previous heart history.

## 2021-03-09 NOTE — ED Notes (Signed)
Niu, MD at bedside. °

## 2021-03-09 NOTE — Progress Notes (Signed)
ANTICOAGULATION CONSULT NOTE Pharmacy Consult for heparin infusion Indication: atrial fibrillation  No Known Allergies  Patient Measurements: Height: 6\' 6"  (198.1 cm) Weight: 90.7 kg (200 lb) IBW/kg (Calculated) : 91.4  Vital Signs: Temp: 98.6 F (37 C) (05/20 1647) Temp Source: Oral (05/20 1647) BP: 140/81 (05/20 1647) Pulse Rate: 89 (05/20 1647)  Labs: Recent Labs    03/09/21 0906 03/09/21 1122 03/09/21 1703 03/09/21 1914  HGB 13.8  --   --   --   HCT 39.4  --   --   --   PLT 163  --   --   --   HEPARINUNFRC  --   --   --  0.68  CREATININE 1.66* 1.34*  --   --   TROPONINIHS 10 15 30*  --     Estimated Creatinine Clearance: 76.1 mL/min (A) (by C-G formula based on SCr of 1.34 mg/dL (H)).   Medical History: Past Medical History:  Diagnosis Date  . Chronic kidney disease 07/27/15   Noted at 07/27/15 [facility] visit as Stage 2  . Hepatitis 11/28/15   positive for Hep C virus antibody    Medications:  Scheduled:  . [START ON 03/10/2021] aspirin EC  81 mg Oral Daily  . diltiazem  30 mg Oral Q12H  . folic acid  1 mg Oral Daily  . insulin aspart  0-5 Units Subcutaneous QHS  . insulin aspart  0-9 Units Subcutaneous TID WC  . LORazepam  0-4 mg Intravenous Q6H   Followed by  . [START ON 03/11/2021] LORazepam  0-4 mg Intravenous Q12H  . multivitamin with minerals  1 tablet Oral Daily  . nicotine  21 mg Transdermal Daily  . thiamine  100 mg Oral Daily   Or  . thiamine  100 mg Intravenous Daily    Assessment: 60 y.o. male with medical history significant of pre-DM, CKD-3a, tobacco abuse, alcohol use, HCV, gout, who presents with chest pain and atrial fibrillation. Pharmacist 46) personally interviewed Mr. Seever and confirmed he is on no medications prior to arrival according to him. Pharmacy has been consulted for heparin dosing and monitoring for afib.   Date/Time HL Comment 5/20 @1914  0.68 Therapeutic x1  Goal of Therapy:  Heparin level 0.3-0.7  units/ml Monitor platelets by anticoagulation protocol: Yes    Plan:   HL therapeutic, will continue heparin at current rate 1450 units/hr  Check anti-Xa level in 6 hours and daily while on heparin  Continue to monitor H&H and platelets  6/20, PharmD Pharmacy Resident  03/09/2021 7:43 PM
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
## 2021-03-09 NOTE — Progress Notes (Signed)
Brief Cardiology Consult Note Imp Afib/AFL  CP/Angina Smoking  Possible CM/CAD HTN  Plan Tele Romi EKGs  Asa 81mg  qd  Troponin  Heparin IV  Consider long term anticoagulantion B-blockers for rate/HTN  Echo Afib R/O CM Advice to quit smoking  Consider stress vs cath Thanks, Emmalyn Hinson

## 2021-03-09 NOTE — Progress Notes (Signed)
ANTICOAGULATION CONSULT NOTE Pharmacy Consult for heparin infusion Indication: atrial fibrillation  No Known Allergies  Patient Measurements: Height: 6\' 6"  (198.1 cm) Weight: 90.7 kg (200 lb) IBW/kg (Calculated) : 91.4  Vital Signs: Temp: 98.1 F (36.7 C) (05/20 0903) Temp Source: Oral (05/20 0903) BP: 108/73 (05/20 0945) Pulse Rate: 97 (05/20 0945)  Labs: Recent Labs    03/09/21 0906  HGB 13.8  HCT 39.4  PLT 163  CREATININE 1.66*  TROPONINIHS 10    Estimated Creatinine Clearance: 61.5 mL/min (A) (by C-G formula based on SCr of 1.66 mg/dL (H)).   Medical History: Past Medical History:  Diagnosis Date  . Chronic kidney disease 07/27/15   Noted at 07/27/15 [facility] visit as Stage 2  . Hepatitis 11/28/15   positive for Hep C virus antibody    Medications:  Scheduled:  . aspirin  324 mg Oral Once  . folic acid  1 mg Oral Daily  . insulin aspart  0-5 Units Subcutaneous QHS  . insulin aspart  0-9 Units Subcutaneous TID WC  . LORazepam  0-4 mg Intravenous Q6H   Followed by  . [START ON 03/11/2021] LORazepam  0-4 mg Intravenous Q12H  . multivitamin with minerals  1 tablet Oral Daily  . nicotine  21 mg Transdermal Daily  . thiamine  100 mg Oral Daily   Or  . thiamine  100 mg Intravenous Daily    Assessment: 60 y.o. male with medical history significant of pre-DM, CKD-3a, tobacco abuse, alcohol use, HCV, gout, who presents with chest pain and atrial fibrillation. I personally interviewed Mr Driggers and he is on no medications prior to arrival according to him.  Goal of Therapy:  Heparin level 0.3-0.7 units/ml Monitor platelets by anticoagulation protocol: Yes   Plan:  Give 6000 units bolus x 1 Start heparin infusion at 1450 units/hr Check anti-Xa level in 6 hours and daily while on heparin Continue to monitor H&H and platelets  Anselm Lis 03/09/2021,11:25 AM
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
## 2021-03-10 ENCOUNTER — Observation Stay
Admit: 2021-03-10 | Discharge: 2021-03-10 | Disposition: A | Discharge status: Home / Self Care | Payer: Self-pay | Attending: Internal Medicine | Admitting: Internal Medicine

## 2021-03-10 DIAGNOSIS — I4891 Unspecified atrial fibrillation: Secondary | ICD-10-CM

## 2021-03-10 DIAGNOSIS — R079 Chest pain, unspecified: Secondary | ICD-10-CM

## 2021-03-10 LAB — BASIC METABOLIC PANEL
Anion gap: 6 (ref 5–15)
BUN: 12 mg/dL (ref 6–20)
CO2: 24 mmol/L (ref 22–32)
Calcium: 8.4 mg/dL — ABNORMAL LOW (ref 8.9–10.3)
Chloride: 105 mmol/L (ref 98–111)
Creatinine, Ser: 1.08 mg/dL (ref 0.61–1.24)
GFR, Estimated: >60 mL/min (ref >60)
Glucose, Bld: 145 mg/dL — ABNORMAL HIGH (ref 70–99)
Potassium: 4.6 mmol/L (ref 3.5–5.1)
Sodium: 135 mmol/L (ref 135–145)

## 2021-03-10 LAB — LIPID PANEL
Cholesterol: 179 mg/dL (ref 0–200)
HDL: 47 mg/dL (ref >40)
LDL Cholesterol: 120 mg/dL — ABNORMAL HIGH (ref 0–99)
Total CHOL/HDL Ratio: 3.8 RATIO
Triglycerides: 61 mg/dL (ref <150)
VLDL: 12 mg/dL (ref 0–40)

## 2021-03-10 LAB — CBC
HCT: 40.5 % (ref 39.0–52.0)
Hemoglobin: 13.9 g/dL (ref 13.0–17.0)
MCH: 31.6 pg (ref 26.0–34.0)
MCHC: 34.3 g/dL (ref 30.0–36.0)
MCV: 92 fL (ref 80.0–100.0)
Platelets: 143 10*3/uL — ABNORMAL LOW (ref 150–400)
RBC: 4.4 MIL/uL (ref 4.22–5.81)
RDW: 14.6 % (ref 11.5–15.5)
WBC: 6 10*3/uL (ref 4.0–10.5)
nRBC: 0 % (ref 0.0–0.2)

## 2021-03-10 LAB — ECHOCARDIOGRAM COMPLETE
AR max vel: 2.62 cm2
AV Area VTI: 2.58 cm2
AV Area mean vel: 3.15 cm2
AV Mean grad: 3 mmHg
AV Peak grad: 8.4 mmHg
Ao pk vel: 1.45 m/s
Area-P 1/2: 3.19 cm2
Height: 78 in
S' Lateral: 2.2 cm
Weight: 3200 oz

## 2021-03-10 LAB — HIV ANTIBODY (ROUTINE TESTING W REFLEX): HIV Screen 4th Generation wRfx: NONREACTIVE

## 2021-03-10 LAB — GLUCOSE, CAPILLARY
Glucose-Capillary: 141 mg/dL — ABNORMAL HIGH (ref 70–99)
Glucose-Capillary: 167 mg/dL — ABNORMAL HIGH (ref 70–99)

## 2021-03-10 LAB — HEPARIN LEVEL (UNFRACTIONATED)
Heparin Unfractionated: 0.69 IU/mL (ref 0.30–0.70)
Heparin Unfractionated: 0.61 IU/mL (ref 0.30–0.70)

## 2021-03-10 LAB — TSH: TSH: 0.86 u[IU]/mL (ref 0.350–4.500)

## 2021-03-10 MED ORDER — ATORVASTATIN CALCIUM 40 MG PO TABS: 40.0000 mg | ORAL_TABLET | Freq: Every day | ORAL | 0 refills | Status: DC

## 2021-03-10 MED ORDER — METOPROLOL SUCCINATE ER 25 MG PO TB24
25.0000 mg | ORAL_TABLET | Freq: Every day | ORAL | Status: DC
  Administered 2021-03-10: 25 mg via ORAL
  Filled 2021-03-10: qty 1

## 2021-03-10 MED ORDER — NICOTINE 21 MG/24HR TD PT24: 21.0000 mg | MEDICATED_PATCH | Freq: Every day | TRANSDERMAL | 0 refills | Status: DC

## 2021-03-10 MED ORDER — ASPIRIN 81 MG PO TBEC: 81.0000 mg | DELAYED_RELEASE_TABLET | Freq: Every day | ORAL | 0 refills | Status: DC

## 2021-03-10 MED ORDER — METOPROLOL SUCCINATE ER 25 MG PO TB24: 25.0000 mg | ORAL_TABLET | Freq: Every day | ORAL | 0 refills | Status: DC

## 2021-03-10 NOTE — Progress Notes (Signed)
PROGRESS NOTE    Vincent [name]  EKC:[id number] DOB: Apr 22, 1961 DOA: 03/09/2021 PCP: Pcp, No   Brief Narrative:  60 y.o. male with medical history significant of pre-DM, CKD-3a, tobacco abuse, alcohol use, HCV, gout, who presents with chest pain.  Patient states that his chest pain started this morning, which is located in the central chest, 6 out of 10 severity, sharp, nonradiating, pleuritic, aggravated by deep breath.  Denies palpitation.  Found to be in atrial fibrillation/flutter with rapid ventricular response.  This is new onset.  Started on IV heparin in the ED.  Cardiology consulted.  Echocardiogram ordered and pending.   Assessment & Plan:   Principal Problem:   Chest pain Active Problems:   Prediabetes   Atrial fibrillation (HCC)   Hyponatremia   Acute renal failure superimposed on stage 3a chronic kidney disease (HCC)   Tobacco abuse   Alcohol use   Hypomagnesemia  Atypical chest pain Unclear etiology Possibly supply demand ischemia Troponins flat PE ruled out IV heparin started for new onset A. Fib Plan: Continue IV heparin for now Follow-up 2D echocardiogram May benefit from anticoagulation Continue telemetry monitoring  New onset atrial fibrillation CHA2DS2-VASc at least 1 Heart rate 90s to 100s Suspect contribution of alcohol Started on Cardizem 30 mg p.o. twice daily Started on IV heparin Plan: Continue IV heparin for now Follow-up 2D echocardiogram Continue Cardizem 30 mg p.o. twice daily  Hyponatremia Initially 129, corrected to 132 Likely secondary to beer potomania/alcohol use Plan: Daily BMP  Acute renal failure on chronic kidney disease stage IIIa Baseline creatinine 1.14 from 12/07/2018 Creatinine on admission 1.66 Likely multifactorial dehydration versus progression of CKD Plan: Avoid nonessential nephrotoxins Recheck creatinine in a.m. Follow-up 2D echocardiogram  Alcohol use Tobacco abuse Counseled patient Offer nicotine  patch CIWA protocol Daily thiamine folate  Hypomagnesemia Corrected after replacement  DVT prophylaxis: IV heparin Code Status: Full Family Communication: None today Disposition Plan: Status is: Observation  The patient will require care spanning > 2 midnights and should be moved to inpatient because: Inpatient level of care appropriate due to severity of illness  Dispo: The patient is from: Home              Anticipated d/c is to: Home              Patient currently is not medically stable to d/c.   Difficult to place patient No  Remains on IV heparin for new onset atrial fibrillation with rapid ventricular response.  Pending 2D echocardiogram and cardiology follow-up.  Disposition plan pending.  Anticipate disposition in the next 24 hours.     Level of care: Progressive Cardiac  Consultants:   Cardiology-[facility]  Procedures:   None  Antimicrobials:   None   Subjective: Seen and examined.  Patient without complaint.  Denies chest pain or palpitations.  Objective: Vitals:   03/09/21 2319 03/10/21 0516 03/10/21 0716 03/10/21 1129  BP: 113/65 126/73 137/85 134/80  Pulse: 79 71 74 75  Resp: 18 18 18 18   Temp: 98.2 F (36.8 C) 98.8 F (37.1 C) 98.2 F (36.8 C) 98.6 F (37 C)  TempSrc: Oral Oral    SpO2: 100% 100% 98% 100%  Weight:      Height:        Intake/Output Summary (Last 24 hours) at 03/10/2021 1240 Last data filed at 03/10/2021 1136 Gross per 24 hour  Intake 720 ml  Output 2700 ml  Net -1980 ml   Filed Weights   03/09/21  0900  Weight: 90.7 kg    Examination:  General exam: Appears calm and comfortable  Respiratory system: Clear to auscultation. Respiratory effort normal. Cardiovascular system: S1-S2, regular rate, irregular rhythm, no murmurs Gastrointestinal system: Abdomen is nondistended, soft and nontender. No organomegaly or masses felt. Normal bowel sounds heard. Central nervous system: Alert and oriented. No focal  neurological deficits. Extremities: Symmetric 5 x 5 power. Skin: No rashes, lesions or ulcers Psychiatry: Judgement and insight appear normal. Mood & affect appropriate.     Data Reviewed: I have personally reviewed following labs and imaging studies  CBC: Recent Labs  Lab 03/09/21 0906 03/10/21 0446  WBC 9.7 6.0  NEUTROABS 4.0  --   HGB 13.8 13.9  HCT 39.4 40.5  MCV 88.7 92.0  PLT 163 143*   Basic Metabolic Panel: Recent Labs  Lab 03/09/21 0906 03/09/21 1122 03/09/21 1950 03/10/21 0056  NA 129* 132* 134* 135  K 3.7 3.6 4.2 4.6  CL 95* 102 103 105  CO2 21* 18* 25 24  GLUCOSE 209* 171* 141* 145*  BUN 11 10 12 12   CREATININE 1.66* 1.34* 1.14 1.08  CALCIUM 8.4* 7.9* 8.3* 8.4*  MG  --  1.3*  --   --   PHOS  --  4.3  --   --    GFR: Estimated Creatinine Clearance: 94.5 mL/min (by C-G formula based on SCr of 1.08 mg/dL). Liver Function Tests: Recent Labs  Lab 03/09/21 0906  AST 48*  ALT 39  ALKPHOS 67  BILITOT 0.7  PROT 7.1  ALBUMIN 3.4*   No results for input(s): LIPASE, AMYLASE in the last 168 hours. Recent Labs  Lab 03/09/21 1122  AMMONIA 21   Coagulation Profile: No results for input(s): INR, PROTIME in the last 168 hours. Cardiac Enzymes: No results for input(s): CKTOTAL, CKMB, CKMBINDEX, TROPONINI in the last 168 hours. BNP (last 3 results) No results for input(s): PROBNP in the last 8760 hours. HbA1C: Recent Labs    03/09/21 1122  HGBA1C 8.9*   CBG: Recent Labs  Lab 03/09/21 1239 03/09/21 1646 03/09/21 2116 03/10/21 0718 03/10/21 1131  GLUCAP 195* 161* 150* 141* 167*   Lipid Profile: Recent Labs    03/10/21 0446  CHOL 179  HDL 47  LDLCALC 120*  TRIG 61  CHOLHDL 3.8   Thyroid Function Tests: Recent Labs    03/10/21 0446  TSH 0.860   Anemia Panel: No results for input(s): VITAMINB12, FOLATE, FERRITIN, TIBC, IRON, RETICCTPCT in the last 72 hours. Sepsis Labs: No results for input(s): PROCALCITON, LATICACIDVEN in the last  168 hours.  Recent Results (from the past 240 hour(s))  Resp Panel by RT-PCR (Flu A&B, Covid) Nasopharyngeal Swab     Status: None   Collection Time: 03/09/21 11:03 AM   Specimen: Nasopharyngeal Swab; Nasopharyngeal(NP) swabs in vial transport medium  Result Value Ref Range Status   SARS Coronavirus 2 by RT PCR NEGATIVE NEGATIVE Final    Comment: (NOTE) SARS-CoV-2 target nucleic acids are NOT DETECTED.  The SARS-CoV-2 RNA is generally detectable in upper respiratory specimens during the acute phase of infection. The lowest concentration of SARS-CoV-2 viral copies this assay can detect is 138 copies/mL. A negative result does not preclude SARS-Cov-2 infection and should not be used as the sole basis for treatment or other patient management decisions. A negative result may occur with  improper specimen collection/handling, submission of specimen other than nasopharyngeal swab, presence of viral mutation(s) within the areas targeted by this assay, and inadequate number  of viral copies(<138 copies/mL). A negative result must be combined with clinical observations, patient history, and epidemiological information. The expected result is Negative.  Fact Sheet for Patients:  BloggerCourse.com  Fact Sheet for Healthcare Providers:  SeriousBroker.it  This test is no t yet approved or cleared by the [place] FDA and  has been authorized for detection and/or diagnosis of SARS-CoV-2 by FDA under an Emergency Use Authorization (EUA). This EUA will remain  in effect (meaning this test can be used) for the duration of the COVID-19 declaration under Section 564(b)(1) of the Act, 21 U.S.C.section 360bbb-3(b)(1), unless the authorization is terminated  or revoked sooner.       Influenza A by PCR NEGATIVE NEGATIVE Final   Influenza B by PCR NEGATIVE NEGATIVE Final    Comment: (NOTE) The Xpert Xpress SARS-CoV-2/FLU/RSV plus assay is  intended as an aid in the diagnosis of influenza from Nasopharyngeal swab specimens and should not be used as a sole basis for treatment. Nasal washings and aspirates are unacceptable for Xpert Xpress SARS-CoV-2/FLU/RSV testing.  Fact Sheet for Patients: BloggerCourse.com  Fact Sheet for Healthcare Providers: SeriousBroker.it  This test is not yet approved or cleared by the [place] FDA and has been authorized for detection and/or diagnosis of SARS-CoV-2 by FDA under an Emergency Use Authorization (EUA). This EUA will remain in effect (meaning this test can be used) for the duration of the COVID-19 declaration under Section 564(b)(1) of the Act, 21 U.S.C. section 360bbb-3(b)(1), unless the authorization is terminated or revoked.  Performed at [facility], [address] [place], [place] [address]          Radiology Studies: DG Chest 1 View  Result Date: 03/09/2021 CLINICAL DATA:  Chest pain EXAM: CHEST  1 VIEW COMPARISON:  10/14/2012 FINDINGS: Heart and mediastinal contours are within normal limits. No focal opacities or effusions. No acute bony abnormality. IMPRESSION: No active disease. Electronically Signed   By: Charlett Nose M.D.   On: 03/09/2021 10:45   NM Pulmonary Perfusion  Result Date: 03/09/2021 CLINICAL DATA:  Elevated D-dimer. Chest pain. Evaluate for pulmonary embolism. EXAM: NUCLEAR MEDICINE PERFUSION LUNG SCAN TECHNIQUE: Perfusion images were obtained in multiple projections after intravenous injection of radiopharmaceutical. Ventilation scans intentionally deferred if perfusion scan and chest x-ray adequate for interpretation during COVID 19 epidemic. RADIOPHARMACEUTICALS:  4.0 mCi Tc-86m MAA IV COMPARISON:  Chest radiograph-earlier same day; 10/14/2012; bilateral lower extremity venous Doppler ultrasound-earlier same day FINDINGS: Review of chest radiograph performed earlier today demonstrates  unchanged borderline enlarged cardiac silhouette and mediastinal contours. No discrete focal airspace opacities. No pleural effusion or pneumothorax. No evidence of edema. Perfusion images demonstrate homogeneous distribution of injected radiotracer throughout the bilateral pulmonary parenchyma without discrete segmental or lobar area of non perfusion to suggest pulmonary embolism. IMPRESSION: Pulmonary embolism absent (very low probability of pulmonary embolism). Electronically Signed   By: Simonne Come M.D.   On: 03/09/2021 14:54   US Venous Img Lower Bilateral (DVT)  Result Date: 03/09/2021 CLINICAL DATA:  Elevated D-dimer. History of smoking. Evaluate for DVT. EXAM: BILATERAL LOWER EXTREMITY VENOUS DOPPLER ULTRASOUND TECHNIQUE: Gray-scale sonography with graded compression, as well as color Doppler and duplex ultrasound were performed to evaluate the lower extremity deep venous systems from the level of the common femoral vein and including the common femoral, femoral, profunda femoral, popliteal and calf veins including the posterior tibial, peroneal and gastrocnemius veins when visible. The superficial great saphenous vein was also interrogated. Spectral Doppler was utilized to evaluate  flow at rest and with distal augmentation maneuvers in the common femoral, femoral and popliteal veins. COMPARISON:  None. FINDINGS: RIGHT LOWER EXTREMITY Common Femoral Vein: No evidence of thrombus. Normal compressibility, respiratory phasicity and response to augmentation. Saphenofemoral Junction: No evidence of thrombus. Normal compressibility and flow on color Doppler imaging. Profunda Femoral Vein: No evidence of thrombus. Normal compressibility and flow on color Doppler imaging. Femoral Vein: No evidence of thrombus. Normal compressibility, respiratory phasicity and response to augmentation. Popliteal Vein: No evidence of thrombus. Normal compressibility, respiratory phasicity and response to augmentation. Calf Veins:  No evidence of thrombus. Normal compressibility and flow on color Doppler imaging. Superficial Great Saphenous Vein: No evidence of thrombus. Normal compressibility. Venous Reflux:  None. Other Findings:  None. LEFT LOWER EXTREMITY Common Femoral Vein: No evidence of thrombus. Normal compressibility, respiratory phasicity and response to augmentation. Saphenofemoral Junction: No evidence of thrombus. Normal compressibility and flow on color Doppler imaging. Profunda Femoral Vein: No evidence of thrombus. Normal compressibility and flow on color Doppler imaging. Femoral Vein: No evidence of thrombus. Normal compressibility, respiratory phasicity and response to augmentation. Popliteal Vein: No evidence of thrombus. Normal compressibility, respiratory phasicity and response to augmentation. Calf Veins: No evidence of thrombus. Normal compressibility and flow on color Doppler imaging. Superficial Great Saphenous Vein: No evidence of thrombus. Normal compressibility. Venous Reflux:  None. Other Findings:  None. IMPRESSION: No evidence of DVT within either lower extremity. Electronically Signed   By: Simonne Come M.D.   On: 03/09/2021 14:53   ECHOCARDIOGRAM COMPLETE  Result Date: 03/10/2021    ECHOCARDIOGRAM REPORT   Patient Name:   Vincent [name] [facility] Date of Exam: 03/10/2021 Medical Rec #:  [id number]       Height:       78.0 in Accession #:    [id number]      Weight:       200.0 lb Date of Birth:  20-Feb-1961       BSA:          2.258 m Patient Age:    60 years        BP:           130/75 mmHg Patient Gender: M               HR:           80 bpm. Exam Location:  [facility] Procedure: 2D Echo, Color Doppler and Cardiac Doppler Indications:    afib                 angina  History:        Patient has no prior history of Echocardiogram examinations.                 Angina, Arrythmias:Atrial Fibrillation and Atrial Flutter; Risk                 Factors:Hypertension.  SonographerTiburcio Pea, ANN Referring Phys: [id number] Tresa Moore IMPRESSIONS  1. Left ventricular ejection fraction, by estimation, is >75%. The left ventricle has hyperdynamic function. The left ventricle has no regional wall motion abnormalities. Left ventricular diastolic parameters were normal.  2. Right ventricular systolic function is normal. The right ventricular size is normal.  3. The mitral valve is normal in structure. Trivial mitral valve regurgitation.  4. The aortic valve is normal in structure. Aortic valve regurgitation is not visualized. FINDINGS  Left Ventricle: Left ventricular ejection fraction, by estimation, is >75%. The left ventricle has hyperdynamic  function. The left ventricle has no regional wall motion abnormalities. The left ventricular internal cavity size was normal in size. There is no left ventricular hypertrophy. Left ventricular diastolic parameters were normal. Right Ventricle: The right ventricular size is normal. No increase in right ventricular wall thickness. Right ventricular systolic function is normal. Left Atrium: Left atrial size was normal in size. Right Atrium: Right atrial size was normal in size. Pericardium: There is no evidence of pericardial effusion. Mitral Valve: The mitral valve is normal in structure. Trivial mitral valve regurgitation. Tricuspid Valve: The tricuspid valve is normal in structure. Tricuspid valve regurgitation is trivial. Aortic Valve: The aortic valve is normal in structure. Aortic valve regurgitation is not visualized. Aortic valve mean gradient measures 3.0 mmHg. Aortic valve peak gradient measures 8.4 mmHg. Aortic valve area, by VTI measures 2.58 cm. Pulmonic Valve: The pulmonic valve was normal in structure. Pulmonic valve regurgitation is not visualized. Aorta: The ascending aorta was not well visualized. IAS/Shunts: No atrial level shunt detected by color flow Doppler.  LEFT VENTRICLE PLAX 2D LVIDd:         4.66 cm  Diastology LVIDs:         2.20 cm  LV e' medial:    47.71 cm/s LV PW:          1.38 cm  LV E/e' medial:  1.8 LV IVS:        1.34 cm  LV e' lateral:   14.50 cm/s LVOT diam:     2.00 cm  LV E/e' lateral: 5.9 LV SV:         71 LV SV Index:   32 LVOT Area:     3.14 cm  RIGHT VENTRICLE TAPSE (M-mode): 2.4 cm LEFT ATRIUM             Index       RIGHT ATRIUM           Index LA diam:        3.70 cm 1.64 cm/m  RA Area:     17.80 cm LA Vol (A2C):   56.2 ml 24.89 ml/m RA Volume:   50.60 ml  22.41 ml/m LA Vol (A4C):   55.9 ml 24.76 ml/m LA Biplane Vol: 58.2 ml 25.78 ml/m  AORTIC VALVE AV Area (Vmax):    2.62 cm AV Area (Vmean):   3.15 cm AV Area (VTI):     2.58 cm AV Vmax:           145.00 cm/s AV Vmean:          83.700 cm/s AV VTI:            0.276 m AV Peak Grad:      8.4 mmHg AV Mean Grad:      3.0 mmHg LVOT Vmax:         121.00 cm/s LVOT Vmean:        84.000 cm/s LVOT VTI:          0.227 m LVOT/AV VTI ratio: 0.82  AORTA Ao Root diam: 3.20 cm Ao Asc diam:  3.00 cm MITRAL VALVE MV Area (PHT): 3.19 cm    SHUNTS MV Decel Time: 238 msec    Systemic VTI:  0.23 m MV E velocity: 85.70 cm/s  Systemic Diam: 2.00 cm Vincent Salome Arnt Callwood MD Electronically signed by Alwyn Peawayne D Callwood MD Signature Date/Time: 03/10/2021/11:31:36 AM    Final         Scheduled Meds: . aspirin EC  81 mg Oral Daily  .  diltiazem  30 mg Oral Q12H  . folic acid  1 mg Oral Daily  . insulin aspart  0-5 Units Subcutaneous QHS  . insulin aspart  0-9 Units Subcutaneous TID WC  . LORazepam  0-4 mg Intravenous Q6H   Followed by  . [START ON 03/11/2021] LORazepam  0-4 mg Intravenous Q12H  . multivitamin with minerals  1 tablet Oral Daily  . nicotine  21 mg Transdermal Daily  . thiamine  100 mg Oral Daily   Or  . thiamine  100 mg Intravenous Daily   Continuous Infusions: . heparin 1,450 Units/hr (03/10/21 0238)     LOS: 0 days    Time spent: 25 minutes    Tresa Moore, MD [facility] Pager [phone]  If 7PM-7AM, please contact night-coverage 03/10/2021, 12:40 PM

## 2021-03-10 NOTE — Progress Notes (Signed)
[facility] Cardiology    SUBJECTIVE: Patient states he feels reasonably well no palpitation no tachycardia no lightheaded no dizziness no further chest pain no previous cardiac history   Vitals:   03/09/21 2319 03/10/21 0516 03/10/21 0716 03/10/21 1129  BP: 113/65 126/73 137/85 134/80  Pulse: 79 71 74 75  Resp: 18 18 18 18   Temp: 98.2 F (36.8 C) 98.8 F (37.1 C) 98.2 F (36.8 C) 98.6 F (37 C)  TempSrc: Oral Oral    SpO2: 100% 100% 98% 100%  Weight:      Height:         Intake/Output Summary (Last 24 hours) at 03/10/2021 1438 Last data filed at 03/10/2021 1350 Gross per 24 hour  Intake 840 ml  Output 2700 ml  Net -1860 ml      PHYSICAL EXAM  General: Well developed, well nourished, in no acute distress HEENT:  Normocephalic and atramatic Neck:  No JVD.  Lungs: Clear bilaterally to auscultation and percussion. Heart: HRRR . Normal S1 and S2 without gallops or murmurs.  Abdomen: Bowel sounds are positive, abdomen soft and non-tender  Msk:  Back normal, normal gait. Normal strength and tone for age. Extremities: No clubbing, cyanosis or edema.   Neuro: Alert and oriented X 3. Psych:  Good affect, responds appropriately   LABS: Basic Metabolic Panel: Recent Labs    03/09/21 1122 03/09/21 1950 03/10/21 0056  NA 132* 134* 135  K 3.6 4.2 4.6  CL 102 103 105  CO2 18* 25 24  GLUCOSE 171* 141* 145*  BUN 10 12 12   CREATININE 1.34* 1.14 1.08  CALCIUM 7.9* 8.3* 8.4*  MG 1.3*  --   --   PHOS 4.3  --   --    Liver Function Tests: Recent Labs    03/09/21 0906  AST 48*  ALT 39  ALKPHOS 67  BILITOT 0.7  PROT 7.1  ALBUMIN 3.4*   No results for input(s): LIPASE, AMYLASE in the last 72 hours. CBC: Recent Labs    03/09/21 0906 03/10/21 0446  WBC 9.7 6.0  NEUTROABS 4.0  --   HGB 13.8 13.9  HCT 39.4 40.5  MCV 88.7 92.0  PLT 163 143*   Cardiac Enzymes: No results for input(s): CKTOTAL, CKMB, CKMBINDEX, TROPONINI in the last 72 hours. BNP: Invalid input(s):  POCBNP D-Dimer: Recent Labs    03/09/21 1122  DDIMER 0.90*   Hemoglobin A1C: Recent Labs    03/09/21 1122  HGBA1C 8.9*   Fasting Lipid Panel: Recent Labs    03/10/21 0446  CHOL 179  HDL 47  LDLCALC 120*  TRIG 61  CHOLHDL 3.8   Thyroid Function Tests: Recent Labs    03/10/21 0446  TSH 0.860   Anemia Panel: No results for input(s): VITAMINB12, FOLATE, FERRITIN, TIBC, IRON, RETICCTPCT in the last 72 hours.  DG Chest 1 View  Result Date: 03/09/2021 CLINICAL DATA:  Chest pain EXAM: CHEST  1 VIEW COMPARISON:  10/14/2012 FINDINGS: Heart and mediastinal contours are within normal limits. No focal opacities or effusions. No acute bony abnormality. IMPRESSION: No active disease. Electronically Signed   By: 03/11/2021 M.D.   On: 03/09/2021 10:45   NM Pulmonary Perfusion  Result Date: 03/09/2021 CLINICAL DATA:  Elevated D-dimer. Chest pain. Evaluate for pulmonary embolism. EXAM: NUCLEAR MEDICINE PERFUSION LUNG SCAN TECHNIQUE: Perfusion images were obtained in multiple projections after intravenous injection of radiopharmaceutical. Ventilation scans intentionally deferred if perfusion scan and chest x-ray adequate for interpretation during COVID 19 epidemic. RADIOPHARMACEUTICALS:  4.0 mCi Tc-6178m MAA IV COMPARISON:  Chest radiograph-earlier same day; 10/14/2012; bilateral lower extremity venous Doppler ultrasound-earlier same day FINDINGS: Review of chest radiograph performed earlier today demonstrates unchanged borderline enlarged cardiac silhouette and mediastinal contours. No discrete focal airspace opacities. No pleural effusion or pneumothorax. No evidence of edema. Perfusion images demonstrate homogeneous distribution of injected radiotracer throughout the bilateral pulmonary parenchyma without discrete segmental or lobar area of non perfusion to suggest pulmonary embolism. IMPRESSION: Pulmonary embolism absent (very low probability of pulmonary embolism). Electronically Signed   By:  Simonne ComeJohn  Watts M.D.   On: 03/09/2021 14:54   US Venous Img Lower Bilateral (DVT)  Result Date: 03/09/2021 CLINICAL DATA:  Elevated D-dimer. History of smoking. Evaluate for DVT. EXAM: BILATERAL LOWER EXTREMITY VENOUS DOPPLER ULTRASOUND TECHNIQUE: Gray-scale sonography with graded compression, as well as color Doppler and duplex ultrasound were performed to evaluate the lower extremity deep venous systems from the level of the common femoral vein and including the common femoral, femoral, profunda femoral, popliteal and calf veins including the posterior tibial, peroneal and gastrocnemius veins when visible. The superficial great saphenous vein was also interrogated. Spectral Doppler was utilized to evaluate flow at rest and with distal augmentation maneuvers in the common femoral, femoral and popliteal veins. COMPARISON:  None. FINDINGS: RIGHT LOWER EXTREMITY Common Femoral Vein: No evidence of thrombus. Normal compressibility, respiratory phasicity and response to augmentation. Saphenofemoral Junction: No evidence of thrombus. Normal compressibility and flow on color Doppler imaging. Profunda Femoral Vein: No evidence of thrombus. Normal compressibility and flow on color Doppler imaging. Femoral Vein: No evidence of thrombus. Normal compressibility, respiratory phasicity and response to augmentation. Popliteal Vein: No evidence of thrombus. Normal compressibility, respiratory phasicity and response to augmentation. Calf Veins: No evidence of thrombus. Normal compressibility and flow on color Doppler imaging. Superficial Great Saphenous Vein: No evidence of thrombus. Normal compressibility. Venous Reflux:  None. Other Findings:  None. LEFT LOWER EXTREMITY Common Femoral Vein: No evidence of thrombus. Normal compressibility, respiratory phasicity and response to augmentation. Saphenofemoral Junction: No evidence of thrombus. Normal compressibility and flow on color Doppler imaging. Profunda Femoral Vein: No evidence  of thrombus. Normal compressibility and flow on color Doppler imaging. Femoral Vein: No evidence of thrombus. Normal compressibility, respiratory phasicity and response to augmentation. Popliteal Vein: No evidence of thrombus. Normal compressibility, respiratory phasicity and response to augmentation. Calf Veins: No evidence of thrombus. Normal compressibility and flow on color Doppler imaging. Superficial Great Saphenous Vein: No evidence of thrombus. Normal compressibility. Venous Reflux:  None. Other Findings:  None. IMPRESSION: No evidence of DVT within either lower extremity. Electronically Signed   By: Simonne ComeJohn  Watts M.D.   On: 03/09/2021 14:53   ECHOCARDIOGRAM COMPLETE  Result Date: 03/10/2021    ECHOCARDIOGRAM REPORT   Patient Name:   Vincent [name] Date of Exam: 03/10/2021 Medical Rec #:  [id number]       Height:       78.0 in Accession #:    [id number]      Weight:       200.0 lb Date of Birth:  [birth year]       BSA:          2.258 m Patient Age:    60 years        BP:           130/75 mmHg Patient Gender: M               HR:  80 bpm. Exam Location:  [facility] Procedure: 2D Echo, Color Doppler and Cardiac Doppler Indications:    afib                 angina  History:        Patient has no prior history of Echocardiogram examinations.                 Angina, Arrythmias:Atrial Fibrillation and Atrial Flutter; Risk                 Factors:Hypertension.  SonographerTiburcio Pea, ANN Referring Phys: [id number] Tresa Moore IMPRESSIONS  1. Left ventricular ejection fraction, by estimation, is >75%. The left ventricle has hyperdynamic function. The left ventricle has no regional wall motion abnormalities. Left ventricular diastolic parameters were normal.  2. Right ventricular systolic function is normal. The right ventricular size is normal.  3. The mitral valve is normal in structure. Trivial mitral valve regurgitation.  4. The aortic valve is normal in structure. Aortic valve regurgitation is not  visualized. FINDINGS  Left Ventricle: Left ventricular ejection fraction, by estimation, is >75%. The left ventricle has hyperdynamic function. The left ventricle has no regional wall motion abnormalities. The left ventricular internal cavity size was normal in size. There is no left ventricular hypertrophy. Left ventricular diastolic parameters were normal. Right Ventricle: The right ventricular size is normal. No increase in right ventricular wall thickness. Right ventricular systolic function is normal. Left Atrium: Left atrial size was normal in size. Right Atrium: Right atrial size was normal in size. Pericardium: There is no evidence of pericardial effusion. Mitral Valve: The mitral valve is normal in structure. Trivial mitral valve regurgitation. Tricuspid Valve: The tricuspid valve is normal in structure. Tricuspid valve regurgitation is trivial. Aortic Valve: The aortic valve is normal in structure. Aortic valve regurgitation is not visualized. Aortic valve mean gradient measures 3.0 mmHg. Aortic valve peak gradient measures 8.4 mmHg. Aortic valve area, by VTI measures 2.58 cm. Pulmonic Valve: The pulmonic valve was normal in structure. Pulmonic valve regurgitation is not visualized. Aorta: The ascending aorta was not well visualized. IAS/Shunts: No atrial level shunt detected by color flow Doppler.  LEFT VENTRICLE PLAX 2D LVIDd:         4.66 cm  Diastology LVIDs:         2.20 cm  LV e' medial:    47.71 cm/s LV PW:         1.38 cm  LV E/e' medial:  1.8 LV IVS:        1.34 cm  LV e' lateral:   14.50 cm/s LVOT diam:     2.00 cm  LV E/e' lateral: 5.9 LV SV:         71 LV SV Index:   32 LVOT Area:     3.14 cm  RIGHT VENTRICLE TAPSE (M-mode): 2.4 cm LEFT ATRIUM             Index       RIGHT ATRIUM           Index LA diam:        3.70 cm 1.64 cm/m  RA Area:     17.80 cm LA Vol (A2C):   56.2 ml 24.89 ml/m RA Volume:   50.60 ml  22.41 ml/m LA Vol (A4C):   55.9 ml 24.76 ml/m LA Biplane Vol: 58.2 ml 25.78  ml/m  AORTIC VALVE AV Area (Vmax):    2.62 cm AV Area (Vmean):  3.15 cm AV Area (VTI):     2.58 cm AV Vmax:           145.00 cm/s AV Vmean:          83.700 cm/s AV VTI:            0.276 m AV Peak Grad:      8.4 mmHg AV Mean Grad:      3.0 mmHg LVOT Vmax:         121.00 cm/s LVOT Vmean:        84.000 cm/s LVOT VTI:          0.227 m LVOT/AV VTI ratio: 0.82  AORTA Ao Root diam: 3.20 cm Ao Asc diam:  3.00 cm MITRAL VALVE MV Area (PHT): 3.19 cm    SHUNTS MV Decel Time: 238 msec    Systemic VTI:  0.23 m MV E velocity: 85.70 cm/s  Systemic Diam: 2.00 cm Adrieanna Boteler D Christain Niznik MD Electronically signed by Alwyn Pea MD Signature Date/Time: 03/10/2021/11:31:36 AM    Final      Echo preserved left ventricular function no evidence of cardiomyopathy no valvular disease noted  TELEMETRY: Normal sinus rhythm nonspecific sttw changes rate of 80  ASSESSMENT AND PLAN:  Principal Problem:   Chest pain Active Problems:   Prediabetes   Atrial fibrillation (HCC)   Hyponatremia   Acute renal failure superimposed on stage 3a chronic kidney disease (HCC)   Tobacco abuse   Alcohol use   Hypomagnesemia    Plan Discontinue heparin patient is converted to sinus rhythm Recommend aspirin I do not necessarily recommend long-term anticoagulation at this point Advised to refrain from alcohol abuse Advised to refrain from tobacco abuse Recommend blood pressure management with metoprolol Correct electrolytes to maintain normal levels Maintain adequate hydration for renal insufficiency Have the patient follow-up with cardiology as an outpatient A. fib a flutter could be related to alcohol abuse Recommend outpatient ischemia work-up either functional study or cardiac cath  Cardiac meds on discharge Aspirin 81 mg a day Metoprolol succinate 25 mg daily Lipitor 40 mg daily Consider NicoDerm for smoking cessation    Alwyn Pea, MD 03/10/2021 2:38 PM

## 2021-03-10 NOTE — Discharge Instructions (Signed)

## 2021-03-10 NOTE — Progress Notes (Signed)
Discharge instructions explained/pt verbalized understanding. IV and tele removed. Will transport off unit via wheelchair when ride arrives.  

## 2021-03-10 NOTE — Progress Notes (Addendum)
ANTICOAGULATION CONSULT NOTE Pharmacy Consult for heparin infusion Indication: atrial fibrillation  No Known Allergies  Patient Measurements: Height: 6\' 6"  (198.1 cm) Weight: 90.7 kg (200 lb) IBW/kg (Calculated) : 91.4  Vital Signs: Temp: 98.2 F (36.8 C) (05/20 2319) Temp Source: Oral (05/20 2319) BP: 113/65 (05/20 2319) Pulse Rate: 79 (05/20 2319)  Labs: Recent Labs    03/09/21 0906 03/09/21 1122 03/09/21 1703 03/09/21 1914 03/09/21 1950 03/10/21 0056  HGB 13.8  --   --   --   --   --   HCT 39.4  --   --   --   --   --   PLT 163  --   --   --   --   --   HEPARINUNFRC  --   --   --  0.68  --  0.69  CREATININE 1.66* 1.34*  --   --  1.14 1.08  TROPONINIHS 10 15 30*  --  27*  --     Estimated Creatinine Clearance: 94.5 mL/min (by C-G formula based on SCr of 1.08 mg/dL).   Medical History: Past Medical History:  Diagnosis Date  . Chronic kidney disease 07/27/15   Noted at 07/27/15 [facility] visit as Stage 2  . Hepatitis 11/28/15   positive for Hep C virus antibody    Medications:  Scheduled:  . aspirin EC  81 mg Oral Daily  . diltiazem  30 mg Oral Q12H  . folic acid  1 mg Oral Daily  . insulin aspart  0-5 Units Subcutaneous QHS  . insulin aspart  0-9 Units Subcutaneous TID WC  . LORazepam  0-4 mg Intravenous Q6H   Followed by  . [START ON 03/11/2021] LORazepam  0-4 mg Intravenous Q12H  . multivitamin with minerals  1 tablet Oral Daily  . nicotine  21 mg Transdermal Daily  . thiamine  100 mg Oral Daily   Or  . thiamine  100 mg Intravenous Daily    Assessment: 60 y.o. male with medical history significant of pre-DM, CKD-3a, tobacco abuse, alcohol use, HCV, gout, who presents with chest pain and atrial fibrillation. Pharmacist 46) personally interviewed Mr. Byers and confirmed he is on no medications prior to arrival according to him. Pharmacy has been consulted for heparin dosing and monitoring for afib.   Date/Time HL Comment 5/20  @1914  0.68 Therapeutic x1 5/21 @ 0056 0.69 Therapeutic x 2 5/21 @ 0511 0.61 Therapeutic x 3  Goal of Therapy:  Heparin level 0.3-0.7 units/ml Monitor platelets by anticoagulation protocol: Yes    Plan:   HL therapeutic, will continue heparin at current rate 1450 units/hr  Will recheck HL with AM labs daily while on heparin  Continue to monitor H&H and platelets  6/21, PharmD, [facility] 03/10/2021 1:42 AM

## 2021-03-10 NOTE — Discharge Summary (Signed)
Physician Discharge Summary  Vincent [name] ZOX:[id number] DOB: July 31, 1961 DOA: 03/09/2021  PCP: Oneita Hurt, No  Admit date: 03/09/2021 Discharge date: 03/10/2021  Admitted From: Home Disposition: Home  Recommendations for Outpatient Follow-up:  1. Follow up with PCP in 1-2 weeks 2. Follow-up with cardiology 1 to 2 weeks  Home Health: No Equipment/Devices: None  Discharge Condition: Stable CODE STATUS: Full Diet recommendation: Heart Healthy  Brief/Interim Summary:  60 y.o.malewith medical history significant ofpre-DM, CKD-3a, tobacco abuse,alcohol use,HCV, gout, who presents with chest pain.  Patient states that his chest pain started this morning, which is located in the central chest, 6 out of 10 severity, sharp, nonradiating, pleuritic, aggravated by deep breath. Denies palpitation.  Found to be in atrial fibrillation/flutter with rapid ventricular response.  This is new onset.  Started on IV heparin in the ED.  Cardiology consulted.  Echocardiogram ordered.  Results reviewed by cardiology.  Hyperdynamic left ventricle with no wall motion abnormalities.  No valvulopathy noted.  Cleared for discharge.  Do not recommend systemic anticoagulation at this time.  On discharge will recommend aspirin 81 mg daily, metoprolol succinate 25 mg daily, Lipitor 40 mg daily.  Smoking cessation advised.  Nicotine patches also prescribed.  Patient can discharge home.  Will follow-up with cardiology 1 to 2 weeks.   Discharge Diagnoses:  Principal Problem:   Chest pain Active Problems:   Prediabetes   Atrial fibrillation (HCC)   Hyponatremia   Acute renal failure superimposed on stage 3a chronic kidney disease (HCC)   Tobacco abuse   Alcohol use   Hypomagnesemia  Atypical chest pain Unclear etiology Possibly supply demand ischemia Troponins flat PE ruled out IV heparin started for new onset A. Fib Presentation inconsistent with ACS 2D echocardiogram, EF 75% with no wall motion  abnormalities DC IV heparin Okay for discharge Follow-up cardiology 1 to 2 weeks  New onset atrial fibrillation CHA2DS2-VASc at least 1 Heart rate 90s to 100s Suspect contribution of alcohol Started on Cardizem 30 mg p.o. twice daily Started on IV heparin Echocardiogram reviewed by cardiology On discharge recommend aspirin 81 mg daily, metoprolol succinate 25 mg daily Follow-up cardiology 1 to 2 weeks  Hyponatremia Initially 129, corrected to 132 Likely secondary to beer potomania/alcohol use   Acute renal failure on chronic kidney disease stage IIIa Baseline creatinine 1.14 from 12/07/2018 Creatinine on admission 1.66 Likely multifactorial dehydration versus progression of CKD Outpatient follow-up  Alcohol use Tobacco abuse Counseled patient Nicotine patches prescribed on discharge  Hypomagnesemia Corrected after replacement Likely secondary to alcohol use  Hyperlipidemia Lipitor 40 mg daily prescribed on discharge  Discharge Instructions  Discharge Instructions    Diet - low sodium heart healthy   Complete by: As directed    Increase activity slowly   Complete by: As directed      Allergies as of 03/10/2021   No Known Allergies     Medication List    STOP taking these medications   amoxicillin 500 MG capsule Commonly known as: AMOXIL   lactulose 10 GM/15ML solution Commonly known as: CHRONULAC     TAKE these medications   aspirin 81 MG EC tablet Take 1 tablet (81 mg total) by mouth daily. Swallow whole. Start taking on: Mar 11, 2021   atorvastatin 40 MG tablet Commonly known as: Lipitor Take 1 tablet (40 mg total) by mouth daily.   colchicine 0.6 MG tablet Take 1 tablet (0.6 mg total) by mouth 2 (two) times daily for 10 days. Until gout flare resolves.  metoprolol succinate 25 MG 24 hr tablet Commonly known as: TOPROL-XL Take 1 tablet (25 mg total) by mouth daily.   nicotine 21 mg/24hr patch Commonly known as: NICODERM CQ - dosed in  mg/24 hours Place 1 patch (21 mg total) onto the skin daily. Start taking on: Mar 11, 2021   Olopatadine HCl 0.2 % Soln Apply 2.5 mLs to eye at bedtime. Apply 1-2 drops both eyes daily at bedtime.       Follow-up Information    Dorothyann Peng D, MD. Schedule an appointment as soon as possible for a visit in 1 week(s).   Specialties: Cardiology, Internal Medicine Contact information: [address] [place] [address] [phone]              No Known Allergies  Consultations:  Cardiology-[facility]   Procedures/Studies: DG Chest 1 View  Result Date: 03/09/2021 CLINICAL DATA:  Chest pain EXAM: CHEST  1 VIEW COMPARISON:  10/14/2012 FINDINGS: Heart and mediastinal contours are within normal limits. No focal opacities or effusions. No acute bony abnormality. IMPRESSION: No active disease. Electronically Signed   By: Charlett Nose M.D.   On: 03/09/2021 10:45   NM Pulmonary Perfusion  Result Date: 03/09/2021 CLINICAL DATA:  Elevated D-dimer. Chest pain. Evaluate for pulmonary embolism. EXAM: NUCLEAR MEDICINE PERFUSION LUNG SCAN TECHNIQUE: Perfusion images were obtained in multiple projections after intravenous injection of radiopharmaceutical. Ventilation scans intentionally deferred if perfusion scan and chest x-ray adequate for interpretation during COVID 19 epidemic. RADIOPHARMACEUTICALS:  4.0 mCi Tc-57m MAA IV COMPARISON:  Chest radiograph-earlier same day; 10/14/2012; bilateral lower extremity venous Doppler ultrasound-earlier same day FINDINGS: Review of chest radiograph performed earlier today demonstrates unchanged borderline enlarged cardiac silhouette and mediastinal contours. No discrete focal airspace opacities. No pleural effusion or pneumothorax. No evidence of edema. Perfusion images demonstrate homogeneous distribution of injected radiotracer throughout the bilateral pulmonary parenchyma without discrete segmental or lobar area of non perfusion to  suggest pulmonary embolism. IMPRESSION: Pulmonary embolism absent (very low probability of pulmonary embolism). Electronically Signed   By: Simonne Come M.D.   On: 03/09/2021 14:54   US Venous Img Lower Bilateral (DVT)  Result Date: 03/09/2021 CLINICAL DATA:  Elevated D-dimer. History of smoking. Evaluate for DVT. EXAM: BILATERAL LOWER EXTREMITY VENOUS DOPPLER ULTRASOUND TECHNIQUE: Gray-scale sonography with graded compression, as well as color Doppler and duplex ultrasound were performed to evaluate the lower extremity deep venous systems from the level of the common femoral vein and including the common femoral, femoral, profunda femoral, popliteal and calf veins including the posterior tibial, peroneal and gastrocnemius veins when visible. The superficial great saphenous vein was also interrogated. Spectral Doppler was utilized to evaluate flow at rest and with distal augmentation maneuvers in the common femoral, femoral and popliteal veins. COMPARISON:  None. FINDINGS: RIGHT LOWER EXTREMITY Common Femoral Vein: No evidence of thrombus. Normal compressibility, respiratory phasicity and response to augmentation. Saphenofemoral Junction: No evidence of thrombus. Normal compressibility and flow on color Doppler imaging. Profunda Femoral Vein: No evidence of thrombus. Normal compressibility and flow on color Doppler imaging. Femoral Vein: No evidence of thrombus. Normal compressibility, respiratory phasicity and response to augmentation. Popliteal Vein: No evidence of thrombus. Normal compressibility, respiratory phasicity and response to augmentation. Calf Veins: No evidence of thrombus. Normal compressibility and flow on color Doppler imaging. Superficial Great Saphenous Vein: No evidence of thrombus. Normal compressibility. Venous Reflux:  None. Other Findings:  None. LEFT LOWER EXTREMITY Common Femoral Vein: No evidence of thrombus. Normal compressibility, respiratory phasicity and  response to augmentation.  Saphenofemoral Junction: No evidence of thrombus. Normal compressibility and flow on color Doppler imaging. Profunda Femoral Vein: No evidence of thrombus. Normal compressibility and flow on color Doppler imaging. Femoral Vein: No evidence of thrombus. Normal compressibility, respiratory phasicity and response to augmentation. Popliteal Vein: No evidence of thrombus. Normal compressibility, respiratory phasicity and response to augmentation. Calf Veins: No evidence of thrombus. Normal compressibility and flow on color Doppler imaging. Superficial Great Saphenous Vein: No evidence of thrombus. Normal compressibility. Venous Reflux:  None. Other Findings:  None. IMPRESSION: No evidence of DVT within either lower extremity. Electronically Signed   By: Simonne Come M.D.   On: 03/09/2021 14:53   ECHOCARDIOGRAM COMPLETE  Result Date: 03/10/2021    ECHOCARDIOGRAM REPORT   Patient Name:   KASEM MOZER [facility] Date of Exam: 03/10/2021 Medical Rec #:  [id number]       Height:       78.0 in Accession #:    [id number]      Weight:       200.0 lb Date of Birth:  [birth year]       BSA:          2.258 m Patient Age:    60 years        BP:           130/75 mmHg Patient Gender: M               HR:           80 bpm. Exam Location:  [facility] Procedure: 2D Echo, Color Doppler and Cardiac Doppler Indications:    afib                 angina  History:        Patient has no prior history of Echocardiogram examinations.                 Angina, Arrythmias:Atrial Fibrillation and Atrial Flutter; Risk                 Factors:Hypertension.  SonographerTiburcio Pea, ANN Referring Phys: [id number] Tresa Moore IMPRESSIONS  1. Left ventricular ejection fraction, by estimation, is >75%. The left ventricle has hyperdynamic function. The left ventricle has no regional wall motion abnormalities. Left ventricular diastolic parameters were normal.  2. Right ventricular systolic function is normal. The right ventricular size is normal.  3. The mitral valve is  normal in structure. Trivial mitral valve regurgitation.  4. The aortic valve is normal in structure. Aortic valve regurgitation is not visualized. FINDINGS  Left Ventricle: Left ventricular ejection fraction, by estimation, is >75%. The left ventricle has hyperdynamic function. The left ventricle has no regional wall motion abnormalities. The left ventricular internal cavity size was normal in size. There is no left ventricular hypertrophy. Left ventricular diastolic parameters were normal. Right Ventricle: The right ventricular size is normal. No increase in right ventricular wall thickness. Right ventricular systolic function is normal. Left Atrium: Left atrial size was normal in size. Right Atrium: Right atrial size was normal in size. Pericardium: There is no evidence of pericardial effusion. Mitral Valve: The mitral valve is normal in structure. Trivial mitral valve regurgitation. Tricuspid Valve: The tricuspid valve is normal in structure. Tricuspid valve regurgitation is trivial. Aortic Valve: The aortic valve is normal in structure. Aortic valve regurgitation is not visualized. Aortic valve mean gradient measures 3.0 mmHg. Aortic valve peak gradient measures 8.4 mmHg. Aortic valve area, by VTI measures 2.58  cm. Pulmonic Valve: The pulmonic valve was normal in structure. Pulmonic valve regurgitation is not visualized. Aorta: The ascending aorta was not well visualized. IAS/Shunts: No atrial level shunt detected by color flow Doppler.  LEFT VENTRICLE PLAX 2D LVIDd:         4.66 cm  Diastology LVIDs:         2.20 cm  LV e' medial:    47.71 cm/s LV PW:         1.38 cm  LV E/e' medial:  1.8 LV IVS:        1.34 cm  LV e' lateral:   14.50 cm/s LVOT diam:     2.00 cm  LV E/e' lateral: 5.9 LV SV:         71 LV SV Index:   32 LVOT Area:     3.14 cm  RIGHT VENTRICLE TAPSE (M-mode): 2.4 cm LEFT ATRIUM             Index       RIGHT ATRIUM           Index LA diam:        3.70 cm 1.64 cm/m  RA Area:     17.80 cm LA  Vol (A2C):   56.2 ml 24.89 ml/m RA Volume:   50.60 ml  22.41 ml/m LA Vol (A4C):   55.9 ml 24.76 ml/m LA Biplane Vol: 58.2 ml 25.78 ml/m  AORTIC VALVE AV Area (Vmax):    2.62 cm AV Area (Vmean):   3.15 cm AV Area (VTI):     2.58 cm AV Vmax:           145.00 cm/s AV Vmean:          83.700 cm/s AV VTI:            0.276 m AV Peak Grad:      8.4 mmHg AV Mean Grad:      3.0 mmHg LVOT Vmax:         121.00 cm/s LVOT Vmean:        84.000 cm/s LVOT VTI:          0.227 m LVOT/AV VTI ratio: 0.82  AORTA Ao Root diam: 3.20 cm Ao Asc diam:  3.00 cm MITRAL VALVE MV Area (PHT): 3.19 cm    SHUNTS MV Decel Time: 238 msec    Systemic VTI:  0.23 m MV E velocity: 85.70 cm/s  Systemic Diam: 2.00 cm Dwayne Salome Arnt MD Electronically signed by Alwyn Pea MD Signature Date/Time: 03/10/2021/11:31:36 AM    Final     (Echo, Carotid, EGD, Colonoscopy, ERCP)    Subjective: Patient seen and examined the day of discharge.  Chest pain resolved.  Tachycardia resolved.  Patient feels well.  Stable for discharge home.  Discharge Exam: Vitals:   03/10/21 0716 03/10/21 1129  BP: 137/85 134/80  Pulse: 74 75  Resp: 18 18  Temp: 98.2 F (36.8 C) 98.6 F (37 C)  SpO2: 98% 100%   Vitals:   03/09/21 2319 03/10/21 0516 03/10/21 0716 03/10/21 1129  BP: 113/65 126/73 137/85 134/80  Pulse: 79 71 74 75  Resp: 18 18 18 18   Temp: 98.2 F (36.8 C) 98.8 F (37.1 C) 98.2 F (36.8 C) 98.6 F (37 C)  TempSrc: Oral Oral    SpO2: 100% 100% 98% 100%  Weight:      Height:        General: Pt is alert, awake, not in acute distress Cardiovascular: RRR,  S1/S2 +, no rubs, no gallops Respiratory: CTA bilaterally, no wheezing, no rhonchi Abdominal: Soft, NT, ND, bowel sounds + Extremities: no edema, no cyanosis    The results of significant diagnostics from this hospitalization (including imaging, microbiology, ancillary and laboratory) are listed below for reference.     Microbiology: Recent Results (from the past 240  hour(s))  Resp Panel by RT-PCR (Flu A&B, Covid) Nasopharyngeal Swab     Status: None   Collection Time: 03/09/21 11:03 AM   Specimen: Nasopharyngeal Swab; Nasopharyngeal(NP) swabs in vial transport medium  Result Value Ref Range Status   SARS Coronavirus 2 by RT PCR NEGATIVE NEGATIVE Final    Comment: (NOTE) SARS-CoV-2 target nucleic acids are NOT DETECTED.  The SARS-CoV-2 RNA is generally detectable in upper respiratory specimens during the acute phase of infection. The lowest concentration of SARS-CoV-2 viral copies this assay can detect is 138 copies/mL. A negative result does not preclude SARS-Cov-2 infection and should not be used as the sole basis for treatment or other patient management decisions. A negative result may occur with  improper specimen collection/handling, submission of specimen other than nasopharyngeal swab, presence of viral mutation(s) within the areas targeted by this assay, and inadequate number of viral copies(<138 copies/mL). A negative result must be combined with clinical observations, patient history, and epidemiological information. The expected result is Negative.  Fact Sheet for Patients:  BloggerCourse.comhttps://www.fda.gov/media/152166/download  Fact Sheet for Healthcare Providers:  SeriousBroker.ithttps://www.fda.gov/media/152162/download  This test is no t yet approved or cleared by the [place] FDA and  has been authorized for detection and/or diagnosis of SARS-CoV-2 by FDA under an Emergency Use Authorization (EUA). This EUA will remain  in effect (meaning this test can be used) for the duration of the COVID-19 declaration under Section 564(b)(1) of the Act, 21 U.S.C.section 360bbb-3(b)(1), unless the authorization is terminated  or revoked sooner.       Influenza A by PCR NEGATIVE NEGATIVE Final   Influenza B by PCR NEGATIVE NEGATIVE Final    Comment: (NOTE) The Xpert Xpress SARS-CoV-2/FLU/RSV plus assay is intended as an aid in the diagnosis of influenza from  Nasopharyngeal swab specimens and should not be used as a sole basis for treatment. Nasal washings and aspirates are unacceptable for Xpert Xpress SARS-CoV-2/FLU/RSV testing.  Fact Sheet for Patients: BloggerCourse.comhttps://www.fda.gov/media/152166/download  Fact Sheet for Healthcare Providers: SeriousBroker.ithttps://www.fda.gov/media/152162/download  This test is not yet approved or cleared by the [place] FDA and has been authorized for detection and/or diagnosis of SARS-CoV-2 by FDA under an Emergency Use Authorization (EUA). This EUA will remain in effect (meaning this test can be used) for the duration of the COVID-19 declaration under Section 564(b)(1) of the Act, 21 U.S.C. section 360bbb-3(b)(1), unless the authorization is terminated or revoked.  Performed at [facility], [address] [place], [place] [address]      Labs: BNP (last 3 results) Recent Labs    03/09/21 0906  BNP 27.3   Basic Metabolic Panel: Recent Labs  Lab 03/09/21 0906 03/09/21 1122 03/09/21 1950 03/10/21 0056  NA 129* 132* 134* 135  K 3.7 3.6 4.2 4.6  CL 95* 102 103 105  CO2 21* 18* 25 24  GLUCOSE 209* 171* 141* 145*  BUN 11 10 12 12   CREATININE 1.66* 1.34* 1.14 1.08  CALCIUM 8.4* 7.9* 8.3* 8.4*  MG  --  1.3*  --   --   PHOS  --  4.3  --   --    Liver Function Tests: Recent Labs  Lab 03/09/21 0906  AST 48*  ALT 39  ALKPHOS 67  BILITOT 0.7  PROT 7.1  ALBUMIN 3.4*   No results for input(s): LIPASE, AMYLASE in the last 168 hours. Recent Labs  Lab 03/09/21 1122  AMMONIA 21   CBC: Recent Labs  Lab 03/09/21 0906 03/10/21 0446  WBC 9.7 6.0  NEUTROABS 4.0  --   HGB 13.8 13.9  HCT 39.4 40.5  MCV 88.7 92.0  PLT 163 143*   Cardiac Enzymes: No results for input(s): CKTOTAL, CKMB, CKMBINDEX, TROPONINI in the last 168 hours. BNP: Invalid input(s): POCBNP CBG: Recent Labs  Lab 03/09/21 1239 03/09/21 1646 03/09/21 2116 03/10/21 0718 03/10/21 1131  GLUCAP 195* 161* 150* 141* 167*    D-Dimer Recent Labs    03/09/21 1122  DDIMER 0.90*   Hgb A1c Recent Labs    03/09/21 1122  HGBA1C 8.9*   Lipid Profile Recent Labs    03/10/21 0446  CHOL 179  HDL 47  LDLCALC 120*  TRIG 61  CHOLHDL 3.8   Thyroid function studies Recent Labs    03/10/21 0446  TSH 0.860   Anemia work up No results for input(s): VITAMINB12, FOLATE, FERRITIN, TIBC, IRON, RETICCTPCT in the last 72 hours. Urinalysis    Component Value Date/Time   COLORURINE STRAW (A) 12/08/2018 0144   APPEARANCEUR CLEAR (A) 12/08/2018 0144   APPEARANCEUR Clear 09/24/2014 1034   LABSPEC 1.002 (L) 12/08/2018 0144   LABSPEC 1.006 09/24/2014 1034   PHURINE 5.0 12/08/2018 0144   GLUCOSEU NEGATIVE 12/08/2018 0144   GLUCOSEU Negative 09/24/2014 1034   HGBUR NEGATIVE 12/08/2018 0144   BILIRUBINUR NEGATIVE 12/08/2018 0144   BILIRUBINUR Negative 09/24/2014 1034   KETONESUR NEGATIVE 12/08/2018 0144   PROTEINUR NEGATIVE 12/08/2018 0144   NITRITE NEGATIVE 12/08/2018 0144   LEUKOCYTESUR NEGATIVE 12/08/2018 0144   LEUKOCYTESUR Negative 09/24/2014 1034   Sepsis Labs Invalid input(s): PROCALCITONIN,  WBC,  LACTICIDVEN Microbiology Recent Results (from the past 240 hour(s))  Resp Panel by RT-PCR (Flu A&B, Covid) Nasopharyngeal Swab     Status: None   Collection Time: 03/09/21 11:03 AM   Specimen: Nasopharyngeal Swab; Nasopharyngeal(NP) swabs in vial transport medium  Result Value Ref Range Status   SARS Coronavirus 2 by RT PCR NEGATIVE NEGATIVE Final    Comment: (NOTE) SARS-CoV-2 target nucleic acids are NOT DETECTED.  The SARS-CoV-2 RNA is generally detectable in upper respiratory specimens during the acute phase of infection. The lowest concentration of SARS-CoV-2 viral copies this assay can detect is 138 copies/mL. A negative result does not preclude SARS-Cov-2 infection and should not be used as the sole basis for treatment or other patient management decisions. A negative result may occur with   improper specimen collection/handling, submission of specimen other than nasopharyngeal swab, presence of viral mutation(s) within the areas targeted by this assay, and inadequate number of viral copies(<138 copies/mL). A negative result must be combined with clinical observations, patient history, and epidemiological information. The expected result is Negative.  Fact Sheet for Patients:  BloggerCourse.com  Fact Sheet for Healthcare Providers:  SeriousBroker.it  This test is no t yet approved or cleared by the [place] FDA and  has been authorized for detection and/or diagnosis of SARS-CoV-2 by FDA under an Emergency Use Authorization (EUA). This EUA will remain  in effect (meaning this test can be used) for the duration of the COVID-19 declaration under Section 564(b)(1) of the Act, 21 U.S.C.section 360bbb-3(b)(1), unless the authorization is terminated  or revoked sooner.  Influenza A by PCR NEGATIVE NEGATIVE Final   Influenza B by PCR NEGATIVE NEGATIVE Final    Comment: (NOTE) The Xpert Xpress SARS-CoV-2/FLU/RSV plus assay is intended as an aid in the diagnosis of influenza from Nasopharyngeal swab specimens and should not be used as a sole basis for treatment. Nasal washings and aspirates are unacceptable for Xpert Xpress SARS-CoV-2/FLU/RSV testing.  Fact Sheet for Patients: BloggerCourse.com  Fact Sheet for Healthcare Providers: SeriousBroker.it  This test is not yet approved or cleared by the [place] FDA and has been authorized for detection and/or diagnosis of SARS-CoV-2 by FDA under an Emergency Use Authorization (EUA). This EUA will remain in effect (meaning this test can be used) for the duration of the COVID-19 declaration under Section 564(b)(1) of the Act, 21 U.S.C. section 360bbb-3(b)(1), unless the authorization is terminated  or revoked.  Performed at [facility], [address] [place], [place] [address]      Time coordinating discharge: Over 30 minutes  SIGNED:   Tresa Moore, MD  [facility] 03/10/2021, 4:04 PM Pager   If 7PM-7AM, please contact night-coverage

## 2021-03-10 NOTE — TOC Progression Note (Signed)
Transition of Care [facility]) - Progression Note    Patient Details  Name: Vincent [name] MRN: [id number] Date of Birth: Dec 29, 1960  [facility]) CM/SW Contact  Bing Quarry, RN Phone Number: 03/10/2021, 4:44 PM  Clinical Narrative:   OBS: Discharge orders written at 1602 03/10/21. Gabriel Cirri RN CM          Expected Discharge Plan and Services           Expected Discharge Date: 03/10/21                                     Social Determinants of Health (SDOH) Interventions    Readmission Risk Interventions No flowsheet data found.

## 2022-01-17 ENCOUNTER — Emergency Department
Admission: EM | Admit: 2022-01-17 | Discharge: 2022-01-17 | Disposition: A | Discharge status: Home / Self Care | Payer: Medicaid Other | Attending: Emergency Medicine | Admitting: Emergency Medicine

## 2022-01-17 ENCOUNTER — Other Ambulatory Visit: Payer: Self-pay

## 2022-01-17 DIAGNOSIS — M109 Gout, unspecified: Secondary | ICD-10-CM | POA: Insufficient documentation

## 2022-01-17 DIAGNOSIS — N189 Chronic kidney disease, unspecified: Secondary | ICD-10-CM | POA: Insufficient documentation

## 2022-01-17 HISTORY — DX: Gout, unspecified: M10.9

## 2022-01-17 MED ORDER — HYDROCODONE-ACETAMINOPHEN 5-325 MG PO TABS: 1.0000 | ORAL_TABLET | Freq: Four times a day (QID) | ORAL | 0 refills | Status: DC | PRN

## 2022-01-17 MED ORDER — KETOROLAC TROMETHAMINE 30 MG/ML IJ SOLN
30.0000 mg | Freq: Once | INTRAMUSCULAR | Status: AC
  Administered 2022-01-17: 30 mg via INTRAMUSCULAR
  Filled 2022-01-17: qty 1

## 2022-01-17 MED ORDER — COLCHICINE 0.6 MG PO TABS: 0.6000 mg | ORAL_TABLET | Freq: Two times a day (BID) | ORAL | 0 refills | Status: DC

## 2022-01-17 NOTE — Discharge Instructions (Signed)
Call your primary doctor if any continued problems.  Take medication only as directed.   Also look at the information about foods that can cause gout.   ?

## 2022-01-17 NOTE — ED Triage Notes (Signed)
Pt c/o gout flare up , states he has a hx of it, pt c/o pain and swelling of BL elbows and hands for the past 2 weeks.  ?

## 2022-01-17 NOTE — ED Provider Notes (Signed)
? ?[facility] ?Provider Note ? ? ? Event Date/Time  ? First MD Initiated Contact with Patient 01/17/22 [phone]   ?  (approximate) ? ? ?History  ? ?Gout ? ? ?HPI ? ?Vincent [name] is a 61 y.o. male   presents to the ED with complaint of bilateral elbow pain.  Patient states that he has a history of gout and that this came on sudden like gout.  He denies any injury to his elbows.  Patient has history of hepatitis and chronic kidney disease along with gout.  He rates his pain as a 10/10. ? ?  ? ? ?Physical Exam  ? ?Triage Vital Signs: ?ED Triage Vitals  ?Enc Vitals Group  ?   BP 01/17/22 0915 120/74  ?   Pulse Rate 01/17/22 0915 86  ?   Resp 01/17/22 0915 19  ?   Temp 01/17/22 0915 (!) 97.5 ?F (36.4 ?C)  ?   Temp Source 01/17/22 0915 Oral  ?   SpO2 01/17/22 0915 99 %  ?   Weight --   ?   Height --   ?   Head Circumference --   ?   Peak Flow --   ?   Pain Score 01/17/22 0911 10  ?   Pain Loc --   ?   Pain Edu? --   ?   Excl. in [place]? --   ? ? ?Most recent vital signs: ?Vitals:  ? 01/17/22 0915  ?BP: 120/74  ?Pulse: 86  ?Resp: 19  ?Temp: (!) 97.5 ?F (36.4 ?C)  ?SpO2: 99%  ? ? ? ?General: Awake, no distress.  ?CV:  Good peripheral perfusion.  Heart regular rate and rhythm. ?Resp:  Normal effort.  Lungs are clear bilaterally. ?Abd:  No distention.  ?Other:  On examination of the elbows bilaterally there is marked tenderness to palpation with warmth.  No erythema is appreciated.  To the right elbow there is some mild soft tissue edema present.  Skin is intact.  Radial pulses are present. ? ? ?ED Results / Procedures / Treatments  ? ?Labs ?(all labs ordered are listed, but only abnormal results are displayed) ?Labs Reviewed - No data to display ? ? ? ?PROCEDURES: ? ?Critical Care performed:  ? ?Procedures ? ? ?MEDICATIONS ORDERED IN ED: ?Medications  ?ketorolac (TORADOL) 30 MG/ML injection 30 mg (30 mg Intramuscular Given 01/17/22 0944)  ? ? ? ?IMPRESSION / MDM / ASSESSMENT AND PLAN / ED COURSE  ?I reviewed  the triage vital signs and the nursing notes. ? ? ?Differential diagnosis includes, but is not limited to, acute gouty arthritis, bilateral bursitis, bilateral osteoarthritis. ? ? ?61 year old male presents to the ED with complaint of bilateral elbow pain with onset sudden, no history of injury but does have positive history for gout.  Patient states this feels very similar to his gout.  Marked tenderness to light palpation bilateral olecranon areas.  Patient was able to drive himself to the emergency department and we explained that we cannot give any narcotics due to possible drowsiness.  An injection of Toradol was given to him while in the emergency department.  A prescription for colchicine 0.6 twice daily and hydrocodone was sent to the pharmacy.  He is to follow-up with his PCP if any continued problems or concerns. ?  ? ? ?FINAL CLINICAL IMPRESSION(S) / ED DIAGNOSES  ? ?Final diagnoses:  ?Acute gout, unspecified cause, unspecified site  ? ? ? ?Rx / DC Orders  ? ?ED  Discharge Orders   ? ?      Ordered  ?  colchicine 0.6 MG tablet  2 times daily       ? 01/17/22 0947  ?  HYDROcodone-acetaminophen (NORCO/VICODIN) 5-325 MG tablet  Every 6 hours PRN       ? 01/17/22 0947  ? ?  ?  ? ?  ? ? ? ?Note:  This document was prepared using Dragon voice recognition software and may include unintentional dictation errors. ?  ?Johnn Hai, PA-C ?01/17/22 1123 ? ?  ?Harvest Dark, MD ?01/17/22 1454 ? ?

## 2022-04-19 IMAGING — NM NM PULMONARY PERF PARTICULATE
1 series · 8 of 8 positions shown · non-contrast
Comparison: Chest radiograph-earlier same day; 10/14/2012;
bilateral lower extremity venous Doppler ultrasound-earlier same day

CLINICAL DATA: Elevated D-dimer. Chest pain. Evaluate for pulmonary
embolism.

EXAM:
NUCLEAR MEDICINE PERFUSION LUNG SCAN
TECHNIQUE: Perfusion images were obtained in multiple projections after
intravenous injection of radiopharmaceutical.
Ventilation scans intentionally deferred if perfusion scan and chest
x-ray adequate for interpretation during COVID 19 epidemic.
RADIOPHARMACEUTICALS:  4.0 mCi Hc-QQm MAA IV

[Series 1000: lung perfusion · 1.95mm/px · 4 acquisitions, 8 frames shown]
[im 1/4]
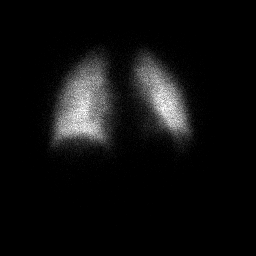
[im 1/4]
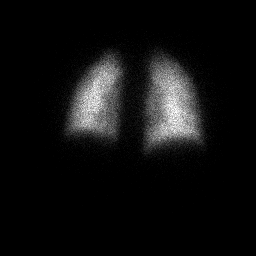
[im 2/4]
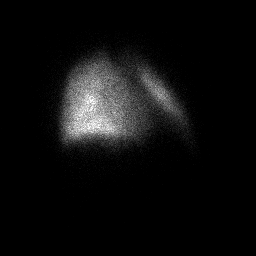
[im 2/4]
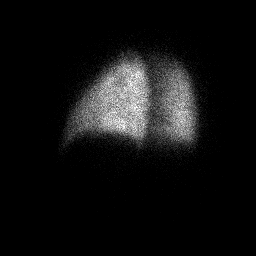
[im 3/4]
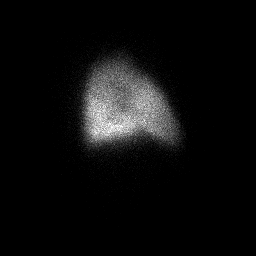
[im 3/4]
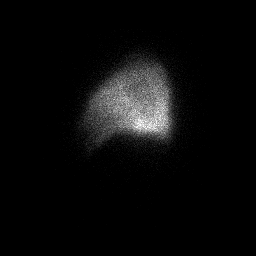
[im 4/4]
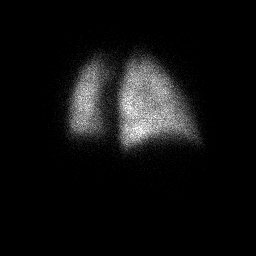
[im 4/4]
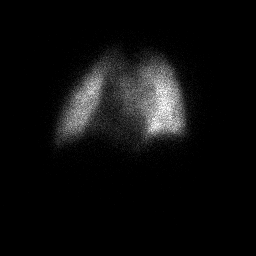

[8 of 8 positions shown; findings below may reference images not displayed]

FINDINGS: Review of chest radiograph performed earlier today demonstrates
unchanged borderline enlarged cardiac silhouette and mediastinal
contours. No discrete focal airspace opacities. No pleural effusion
or pneumothorax. No evidence of edema.

Perfusion images demonstrate homogeneous distribution of injected
radiotracer throughout the bilateral pulmonary parenchyma without
discrete segmental or lobar area of non perfusion to suggest
pulmonary embolism.
IMPRESSION: Pulmonary embolism absent (very low probability of pulmonary
embolism).

## 2022-04-19 IMAGING — DX DG CHEST 1V
1 series · 1 of 1 positions shown · non-contrast
Comparison: 10/14/2012

CLINICAL DATA: Chest pain

EXAM:
CHEST  1 VIEW

[chest ap]
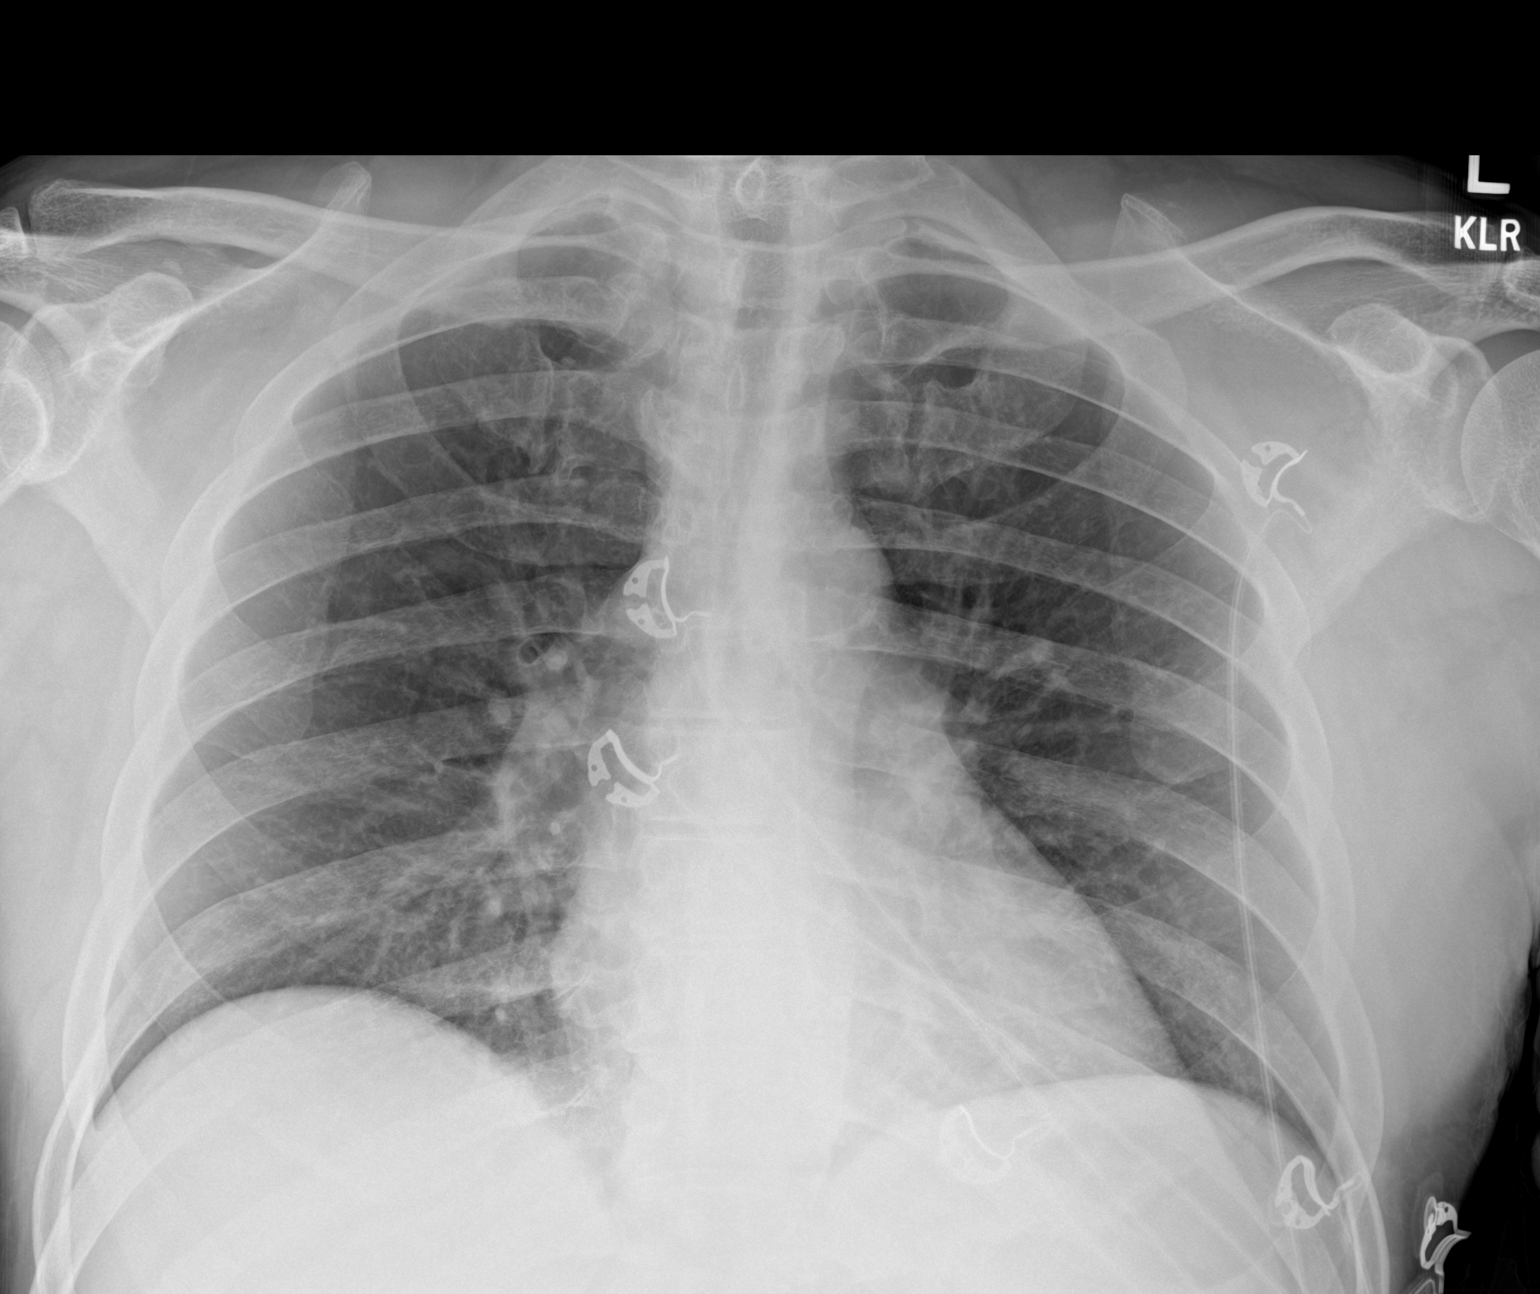

[1 of 1 positions shown; findings below may reference images not displayed]

FINDINGS: Heart and mediastinal contours are within normal limits. No focal
opacities or effusions. No acute bony abnormality.
IMPRESSION: No active disease.

## 2022-04-19 IMAGING — US US EXTREM LOW VENOUS
1 series · 13 of 24 positions shown · non-contrast
Comparison: None.

CLINICAL DATA: Elevated D-dimer. History of smoking. Evaluate for
DVT.



[Series 1: us venous img lower bilat (dvt) · portal-venous · 13 of 60 slices shown]
[im 1/60]
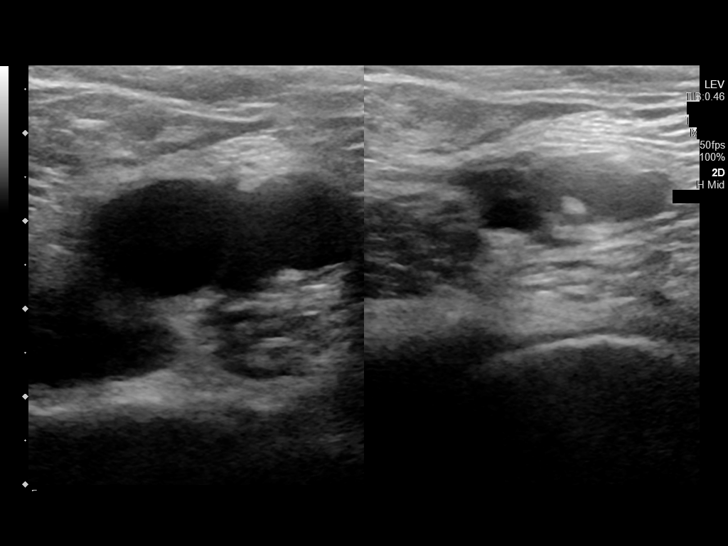
[im 6/60]
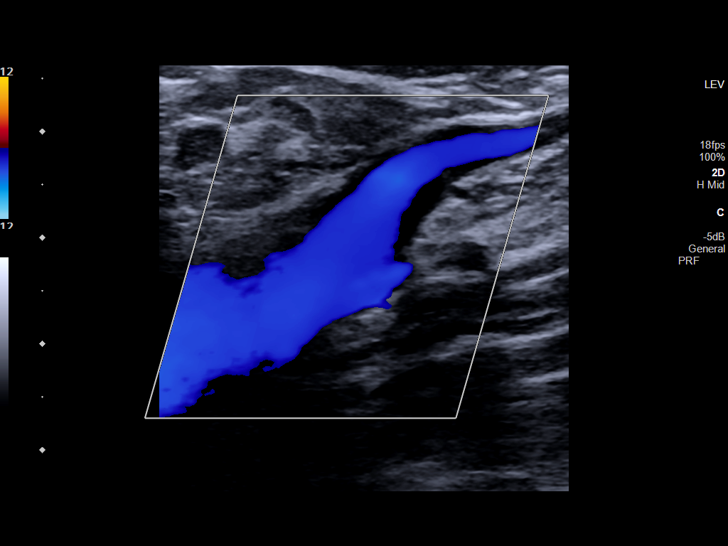
[im 11/60]
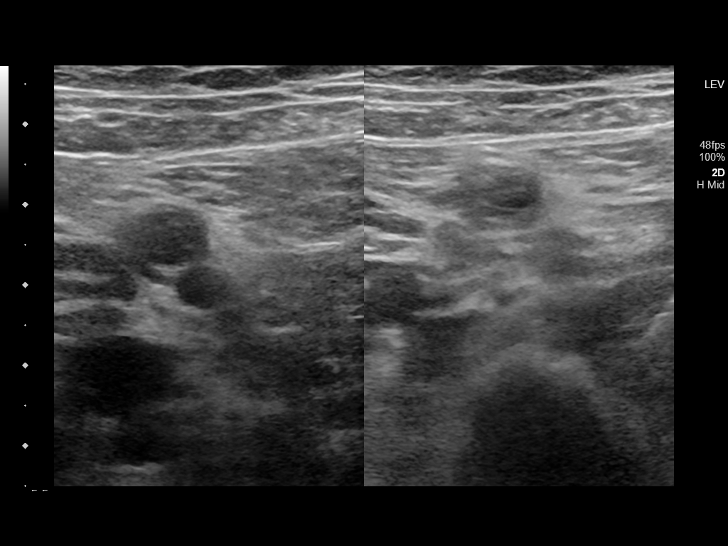
[im 16/60]
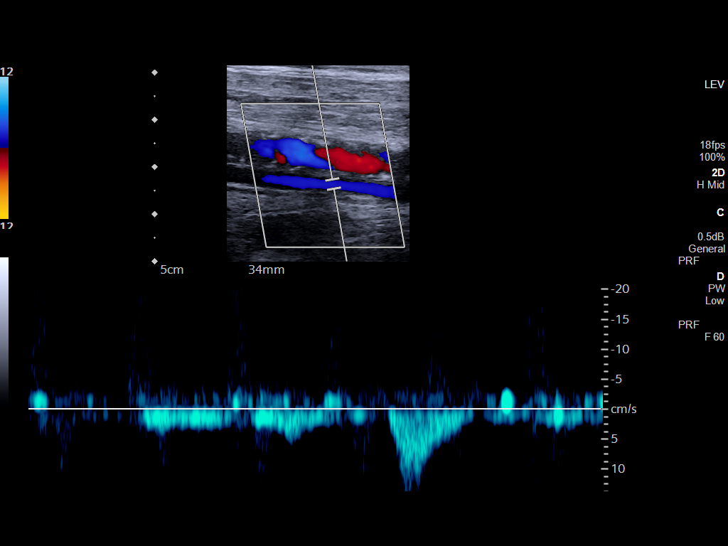
[im 21/60]
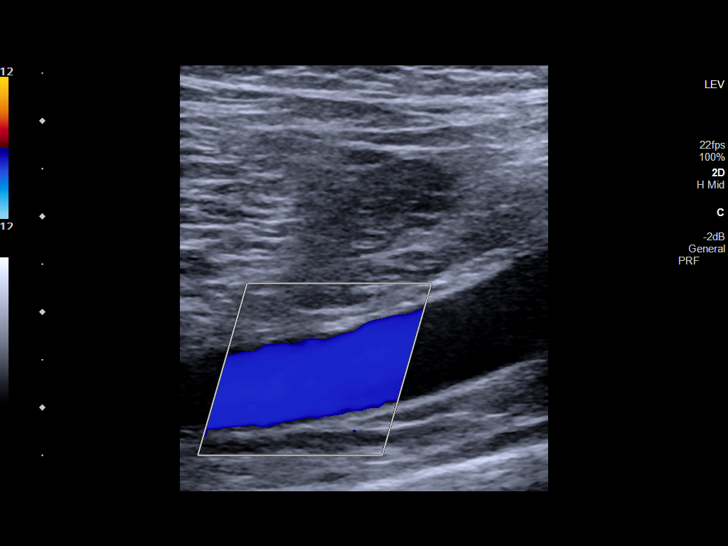
[im 26/60]
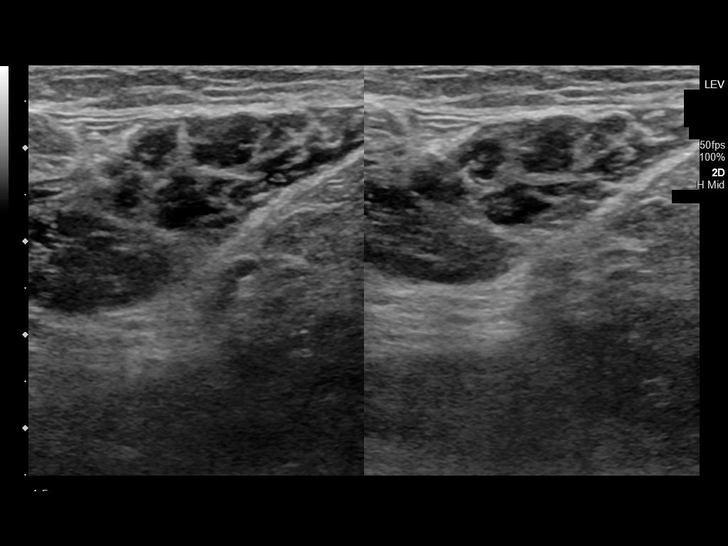
[im 31/60]
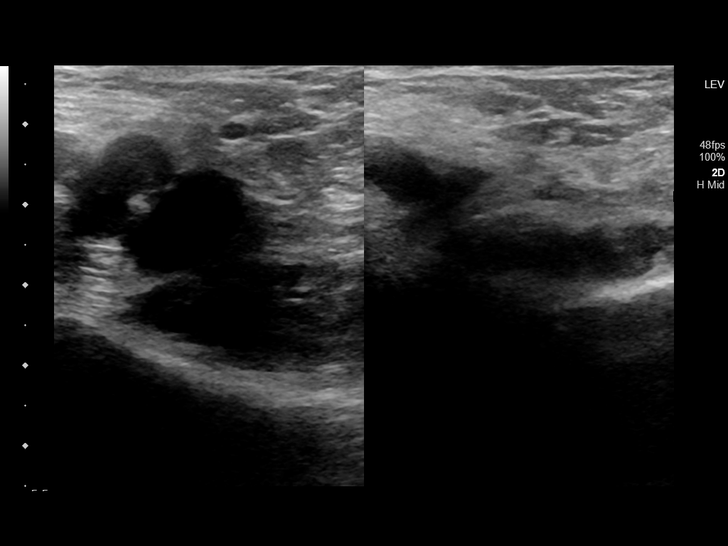
[im 34/60]
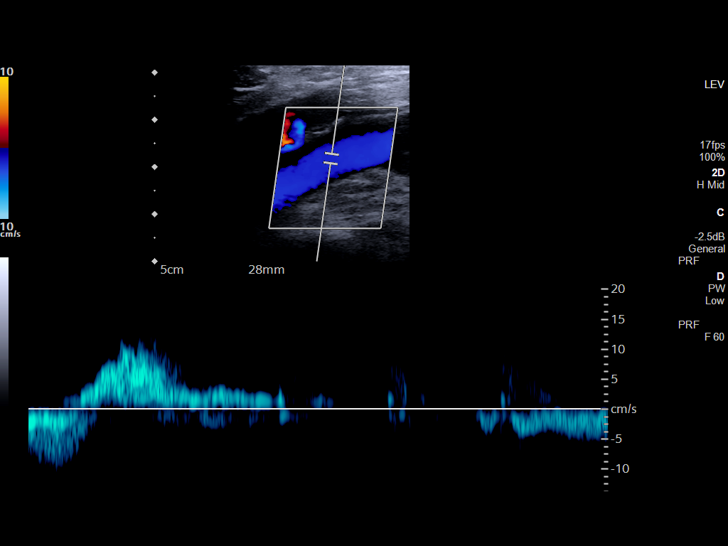
[im 39/60]
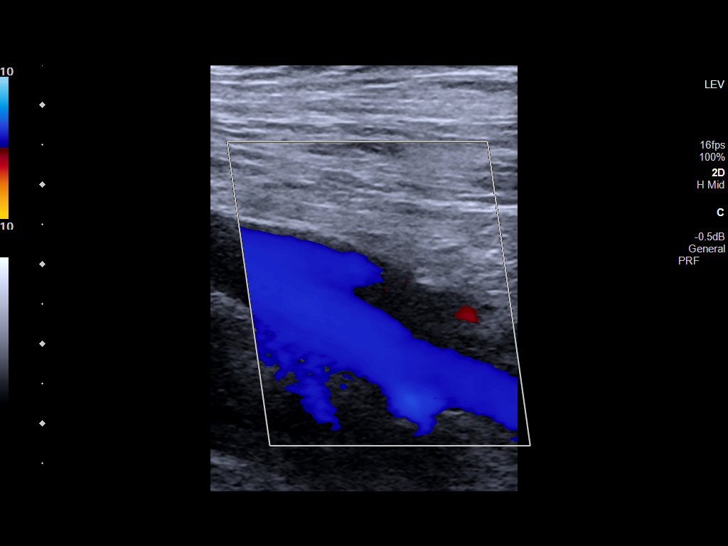
[im 44/60]
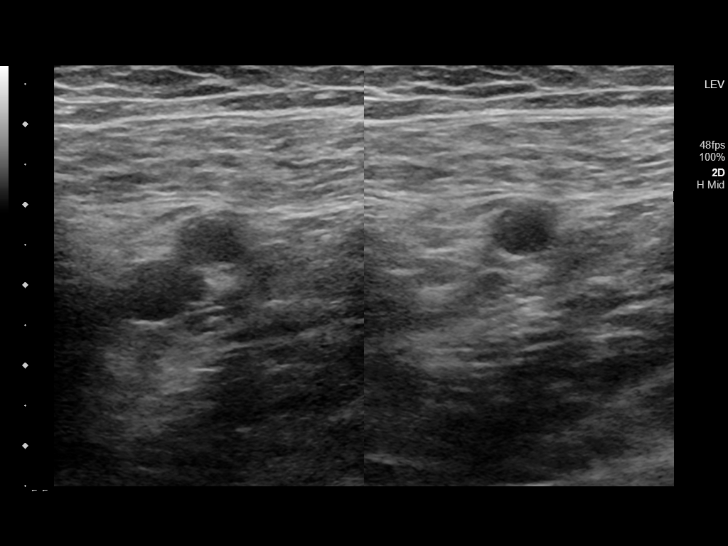
[im 49/60]
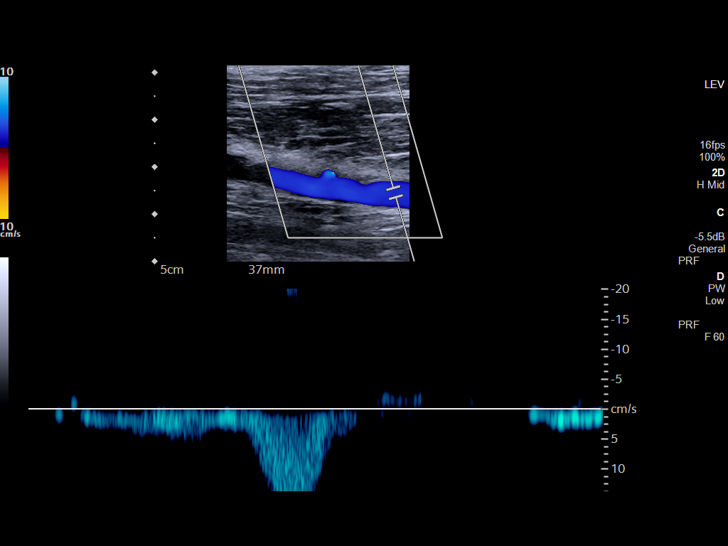
[im 54/60]
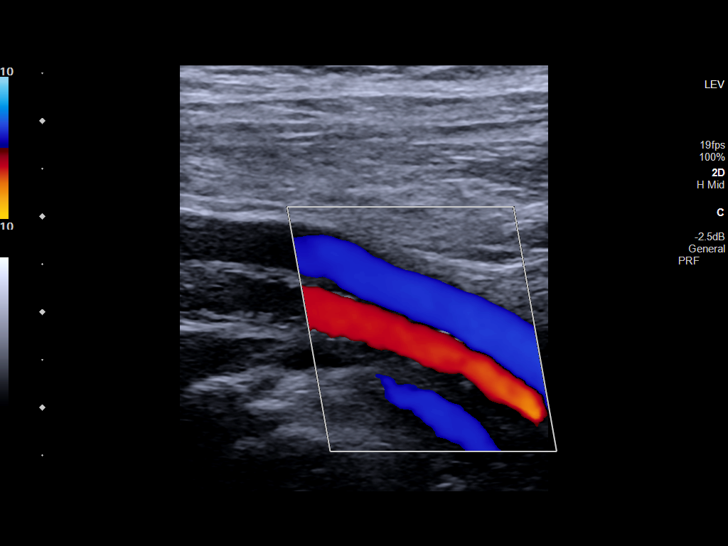
[im 60/60]
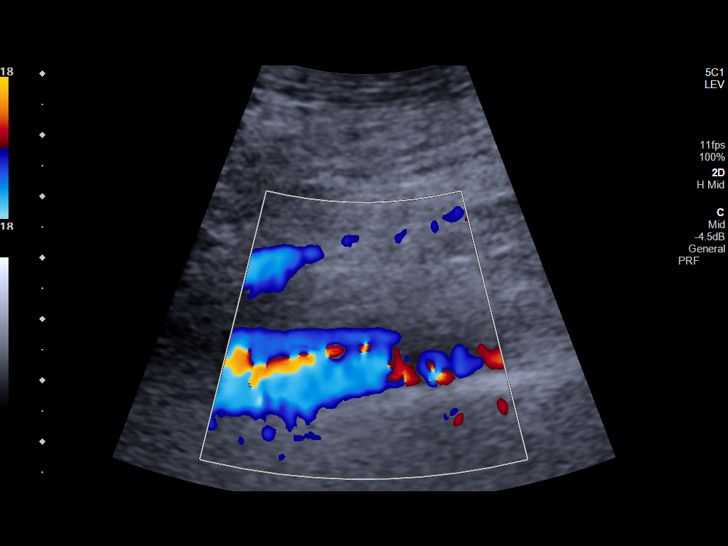

[13 of 24 positions shown; findings below may reference images not displayed]

FINDINGS: RIGHT LOWER EXTREMITY

Common Femoral Vein: No evidence of thrombus. Normal
compressibility, respiratory phasicity and response to augmentation.

Saphenofemoral Junction: No evidence of thrombus. Normal
compressibility and flow on color Doppler imaging.

Profunda Femoral Vein: No evidence of thrombus. Normal
compressibility and flow on color Doppler imaging.

Femoral Vein: No evidence of thrombus. Normal compressibility,
respiratory phasicity and response to augmentation.

Popliteal Vein: No evidence of thrombus. Normal compressibility,
respiratory phasicity and response to augmentation.

Calf Veins: No evidence of thrombus. Normal compressibility and flow
on color Doppler imaging.

Superficial Great Saphenous Vein: No evidence of thrombus. Normal
compressibility.

Venous Reflux:  None.

Other Findings:  None.

LEFT LOWER EXTREMITY

Common Femoral Vein: No evidence of thrombus. Normal
compressibility, respiratory phasicity and response to augmentation.

Saphenofemoral Junction: No evidence of thrombus. Normal
compressibility and flow on color Doppler imaging.

Profunda Femoral Vein: No evidence of thrombus. Normal
compressibility and flow on color Doppler imaging.

Femoral Vein: No evidence of thrombus. Normal compressibility,
respiratory phasicity and response to augmentation.

Popliteal Vein: No evidence of thrombus. Normal compressibility,
respiratory phasicity and response to augmentation.

Calf Veins: No evidence of thrombus. Normal compressibility and flow
on color Doppler imaging.

Superficial Great Saphenous Vein: No evidence of thrombus. Normal
compressibility.

Venous Reflux:  None.

Other Findings:  None.
IMPRESSION: No evidence of DVT within either lower extremity.

## 2022-08-27 ENCOUNTER — Emergency Department: Payer: Medicaid Other

## 2022-08-27 ENCOUNTER — Inpatient Hospital Stay
Admission: EM | Admit: 2022-08-27 | Discharge: 2022-08-30 | DRG: 271 | Disposition: A | Discharge status: Home / Self Care | Payer: Medicaid Other | Attending: Internal Medicine | Admitting: Internal Medicine

## 2022-08-27 ENCOUNTER — Other Ambulatory Visit: Payer: Self-pay

## 2022-08-27 ENCOUNTER — Encounter: Payer: Self-pay | Admitting: Emergency Medicine

## 2022-08-27 DIAGNOSIS — E1151 Type 2 diabetes mellitus with diabetic peripheral angiopathy without gangrene: Secondary | ICD-10-CM | POA: Diagnosis present

## 2022-08-27 DIAGNOSIS — I998 Other disorder of circulatory system: Secondary | ICD-10-CM

## 2022-08-27 DIAGNOSIS — I723 Aneurysm of iliac artery: Secondary | ICD-10-CM | POA: Diagnosis not present

## 2022-08-27 DIAGNOSIS — Z833 Family history of diabetes mellitus: Secondary | ICD-10-CM

## 2022-08-27 DIAGNOSIS — Z95828 Presence of other vascular implants and grafts: Secondary | ICD-10-CM | POA: Diagnosis not present

## 2022-08-27 DIAGNOSIS — I739 Peripheral vascular disease, unspecified: Secondary | ICD-10-CM

## 2022-08-27 DIAGNOSIS — N182 Chronic kidney disease, stage 2 (mild): Secondary | ICD-10-CM | POA: Diagnosis present

## 2022-08-27 DIAGNOSIS — N400 Enlarged prostate without lower urinary tract symptoms: Secondary | ICD-10-CM | POA: Diagnosis present

## 2022-08-27 DIAGNOSIS — E871 Hypo-osmolality and hyponatremia: Secondary | ICD-10-CM | POA: Diagnosis present

## 2022-08-27 DIAGNOSIS — E1142 Type 2 diabetes mellitus with diabetic polyneuropathy: Secondary | ICD-10-CM

## 2022-08-27 DIAGNOSIS — Z79899 Other long term (current) drug therapy: Secondary | ICD-10-CM

## 2022-08-27 DIAGNOSIS — Z7984 Long term (current) use of oral hypoglycemic drugs: Secondary | ICD-10-CM | POA: Diagnosis not present

## 2022-08-27 DIAGNOSIS — I70202 Unspecified atherosclerosis of native arteries of extremities, left leg: Principal | ICD-10-CM

## 2022-08-27 DIAGNOSIS — I4891 Unspecified atrial fibrillation: Secondary | ICD-10-CM | POA: Diagnosis present

## 2022-08-27 DIAGNOSIS — Z7982 Long term (current) use of aspirin: Secondary | ICD-10-CM | POA: Diagnosis not present

## 2022-08-27 DIAGNOSIS — Z720 Tobacco use: Secondary | ICD-10-CM | POA: Diagnosis present

## 2022-08-27 DIAGNOSIS — Z9889 Other specified postprocedural states: Secondary | ICD-10-CM | POA: Diagnosis not present

## 2022-08-27 DIAGNOSIS — I70222 Atherosclerosis of native arteries of extremities with rest pain, left leg: Secondary | ICD-10-CM | POA: Diagnosis present

## 2022-08-27 DIAGNOSIS — E785 Hyperlipidemia, unspecified: Secondary | ICD-10-CM | POA: Diagnosis present

## 2022-08-27 DIAGNOSIS — I700 Atherosclerosis of aorta: Secondary | ICD-10-CM | POA: Diagnosis not present

## 2022-08-27 DIAGNOSIS — F17210 Nicotine dependence, cigarettes, uncomplicated: Secondary | ICD-10-CM | POA: Diagnosis present

## 2022-08-27 DIAGNOSIS — E1122 Type 2 diabetes mellitus with diabetic chronic kidney disease: Secondary | ICD-10-CM | POA: Diagnosis present

## 2022-08-27 DIAGNOSIS — Z8249 Family history of ischemic heart disease and other diseases of the circulatory system: Secondary | ICD-10-CM

## 2022-08-27 DIAGNOSIS — M109 Gout, unspecified: Secondary | ICD-10-CM | POA: Diagnosis present

## 2022-08-27 DIAGNOSIS — I743 Embolism and thrombosis of arteries of the lower extremities: Secondary | ICD-10-CM | POA: Diagnosis not present

## 2022-08-27 DIAGNOSIS — I70201 Unspecified atherosclerosis of native arteries of extremities, right leg: Secondary | ICD-10-CM | POA: Diagnosis not present

## 2022-08-27 DIAGNOSIS — Z7901 Long term (current) use of anticoagulants: Secondary | ICD-10-CM | POA: Diagnosis not present

## 2022-08-27 HISTORY — DX: Chronic obstructive pulmonary disease, unspecified: J44.9

## 2022-08-27 HISTORY — DX: Type 2 diabetes mellitus without complications: E11.9

## 2022-08-27 HISTORY — DX: Essential (primary) hypertension: I10

## 2022-08-27 HISTORY — DX: Atherosclerotic heart disease of native coronary artery without angina pectoris: I25.10

## 2022-08-27 LAB — COMPREHENSIVE METABOLIC PANEL
ALT: 26 U/L (ref 0–44)
AST: 35 U/L (ref 15–41)
Albumin: 3.6 g/dL (ref 3.5–5.0)
Alkaline Phosphatase: 87 U/L (ref 38–126)
Anion gap: 12 (ref 5–15)
BUN: 6 mg/dL (ref 6–20)
CO2: 20 mmol/L — ABNORMAL LOW (ref 22–32)
Calcium: 8.3 mg/dL — ABNORMAL LOW (ref 8.9–10.3)
Chloride: 100 mmol/L (ref 98–111)
Creatinine, Ser: 1.04 mg/dL (ref 0.61–1.24)
GFR, Estimated: >60 mL/min (ref >60)
Glucose, Bld: 165 mg/dL — ABNORMAL HIGH (ref 70–99)
Potassium: 3.7 mmol/L (ref 3.5–5.1)
Sodium: 132 mmol/L — ABNORMAL LOW (ref 135–145)
Total Bilirubin: 0.9 mg/dL (ref 0.3–1.2)
Total Protein: 7.3 g/dL (ref 6.5–8.1)

## 2022-08-27 LAB — APTT: aPTT: 28 seconds (ref 24–36)

## 2022-08-27 LAB — CBC WITH DIFFERENTIAL/PLATELET
Abs Immature Granulocytes: 0.02 10*3/uL (ref 0.00–0.07)
Basophils Absolute: 0.1 10*3/uL (ref 0.0–0.1)
Basophils Relative: 1 %
Eosinophils Absolute: 0.2 10*3/uL (ref 0.0–0.5)
Eosinophils Relative: 3 %
HCT: 42.5 % (ref 39.0–52.0)
Hemoglobin: 14.9 g/dL (ref 13.0–17.0)
Immature Granulocytes: 0 %
Lymphocytes Relative: 65 %
Lymphs Abs: 4.3 10*3/uL — ABNORMAL HIGH (ref 0.7–4.0)
MCH: 30.3 pg (ref 26.0–34.0)
MCHC: 35.1 g/dL (ref 30.0–36.0)
MCV: 86.6 fL (ref 80.0–100.0)
Monocytes Absolute: 0.3 10*3/uL (ref 0.1–1.0)
Monocytes Relative: 5 %
Neutro Abs: 1.7 10*3/uL (ref 1.7–7.7)
Neutrophils Relative %: 26 %
Platelets: 170 10*3/uL (ref 150–400)
RBC: 4.91 MIL/uL (ref 4.22–5.81)
RDW: 14.2 % (ref 11.5–15.5)
WBC: 6.6 10*3/uL (ref 4.0–10.5)
nRBC: 0 % (ref 0.0–0.2)

## 2022-08-27 LAB — PROTIME-INR
INR: 1.1 (ref 0.8–1.2)
Prothrombin Time: 14 seconds (ref 11.4–15.2)

## 2022-08-27 LAB — TYPE AND SCREEN
ABO/RH(D): O POS
Antibody Screen: NEGATIVE

## 2022-08-27 MED ORDER — HYDROCODONE-ACETAMINOPHEN 5-325 MG PO TABS: 1.0000 | ORAL_TABLET | Freq: Once | ORAL | Status: DC

## 2022-08-27 MED ORDER — TRAZODONE HCL 50 MG PO TABS: 25.0000 mg | ORAL_TABLET | Freq: Every evening | ORAL | Status: DC | PRN

## 2022-08-27 MED ORDER — ONDANSETRON HCL 4 MG PO TABS: 4.0000 mg | ORAL_TABLET | Freq: Four times a day (QID) | ORAL | Status: DC | PRN

## 2022-08-27 MED ORDER — SODIUM CHLORIDE 0.9 % IV SOLN: INTRAVENOUS | Status: DC

## 2022-08-27 MED ORDER — HEPARIN BOLUS VIA INFUSION
7000.0000 [IU] | Freq: Once | INTRAVENOUS | Status: AC
  Administered 2022-08-27: 7000 [IU] via INTRAVENOUS
  Filled 2022-08-27: qty 7000

## 2022-08-27 MED ORDER — HEPARIN (PORCINE) 25000 UT/250ML-% IV SOLN
1700.0000 [IU]/h | INTRAVENOUS | Status: DC
  Administered 2022-08-27: 1700 [IU]/h via INTRAVENOUS
  Filled 2022-08-27: qty 250

## 2022-08-27 MED ORDER — HYDROMORPHONE HCL 1 MG/ML IJ SOLN
1.0000 mg | INTRAMUSCULAR | Status: DC | PRN
  Administered 2022-08-27 – 2022-08-28 (×3): 1 mg via INTRAVENOUS
  Filled 2022-08-27 (×3): qty 1

## 2022-08-27 MED ORDER — HYDROMORPHONE HCL 1 MG/ML IJ SOLN
1.0000 mg | Freq: Once | INTRAMUSCULAR | Status: AC
  Administered 2022-08-27: 1 mg via INTRAVENOUS
  Filled 2022-08-27: qty 1

## 2022-08-27 MED ORDER — ONDANSETRON HCL 4 MG/2ML IJ SOLN: 4.0000 mg | Freq: Four times a day (QID) | INTRAMUSCULAR | Status: DC | PRN

## 2022-08-27 MED ORDER — ACETAMINOPHEN 650 MG RE SUPP: 650.0000 mg | Freq: Four times a day (QID) | RECTAL | Status: DC | PRN

## 2022-08-27 MED ORDER — IOHEXOL 350 MG/ML SOLN
125.0000 mL | Freq: Once | INTRAVENOUS | Status: AC | PRN
  Administered 2022-08-27: 125 mL via INTRAVENOUS

## 2022-08-27 MED ORDER — SODIUM CHLORIDE 0.9 % IV SOLN: Freq: Once | INTRAVENOUS | Status: AC

## 2022-08-27 MED ORDER — ACETAMINOPHEN 325 MG PO TABS: 650.0000 mg | ORAL_TABLET | Freq: Four times a day (QID) | ORAL | Status: DC | PRN

## 2022-08-27 MED ORDER — ONDANSETRON HCL 4 MG/2ML IJ SOLN
4.0000 mg | Freq: Once | INTRAMUSCULAR | Status: AC
  Administered 2022-08-27: 4 mg via INTRAVENOUS
  Filled 2022-08-27: qty 2

## 2022-08-27 MED ORDER — MAGNESIUM HYDROXIDE 400 MG/5ML PO SUSP: 30.0000 mL | Freq: Every day | ORAL | Status: DC | PRN

## 2022-08-27 NOTE — ED Notes (Signed)
Bradler at bedside updating pt.

## 2022-08-27 NOTE — H&P (View-Only) (Signed)
The patient has noted abrupt left popliteal artery occlusion.  Patient should be kept n.p.o. at midnight.  We will plan on proceeding with angiogram in the morning.  Full consult to follow.

## 2022-08-27 NOTE — Consult Note (Signed)
ANTICOAGULATION CONSULT NOTE - Initial Consult  Pharmacy Consult for heparin infusion Indication: DVT  No Known Allergies  Patient Measurements: Height: 6\' 6"  (198.1 cm) Weight: 99.8 kg (220 lb) IBW/kg (Calculated) : 91.4 Heparin Dosing Weight: 99.8kg  Vital Signs: BP: 111/69 (11/07 1615) Pulse Rate: 96 (11/07 1615)  Labs: No results for input(s): "HGB", "HCT", "PLT", "APTT", "LABPROT", "INR", "HEPARINUNFRC", "HEPRLOWMOCWT", "CREATININE", "CKTOTAL", "CKMB", "TROPONINIHS" in the last 72 hours.  CrCl cannot be calculated (Patient's most recent lab result is older than the maximum 21 days allowed.).   Medical History: Past Medical History:  Diagnosis Date   Chronic kidney disease 07/27/2015   Noted at 07/27/15 [facility] visit as Stage 2   Gout    Hepatitis 11/28/2015   positive for Hep C virus antibody    Medications:  PTA: N/A Inpatient: Heparin infusion (11/7 >>>) Allergies: NKDA  Assessment: 61 year old male with PMH atrial fibrillation, tobacco use, alcohol use, and CKD presents to ED with c/o right leg pain starting 3 days ago.Marland Kitchen Upon physical examination, left foot cold to the touch and unable to find pedal pulse. Right foot warm to touch and weak pedal pulse palpated. Venous doppler study suspicious for occlusive thrombus within the left popliteal artery.  Goal of Therapy:  Heparin level 0.3-0.7 units/ml Monitor platelets by anticoagulation protocol: Yes  Date Time aPTT/HL Rate/Comment   Plan:  Order STAT aPTT Give 7000 units bolus x1; then start heparin infusion at 1700 units/hr Check anti-Xa level in 6 hours and daily once consecutively therapeutic. Continue to monitor H&H and platelets daily while on heparin gtt.  [place] Pharmacist 08/27/2022 6:43 PM

## 2022-08-27 NOTE — Assessment & Plan Note (Signed)
-   We will place him on supplemental coverage with NovoLog. - We will hold off metformin. - We will continue Neurontin and Cymbalta.

## 2022-08-27 NOTE — ED Notes (Signed)
Assessed lower extremities. Left foot cold to the touch and unable to find pedal pulse. Right foot warm to touch and weak pedal pulse palpated. Pt in obvious distress when removing socks to perform assessment.

## 2022-08-27 NOTE — ED Notes (Signed)
First Nurse Note: Pt to ED via [facility] from home for leg pain. Pt has recent hx/o clots. VSS per EMS.

## 2022-08-27 NOTE — ED Provider Triage Note (Signed)
  Emergency Medicine Provider Triage Evaluation Note  Vincent [name] , a 61 y.o.male,  was evaluated in triage.  Pt complains of pain in the left lower extremity x3 days.  Patient states that he had a bilateral lower extremity thrombectomy approximately 3 months ago.  He states over the past few days, he has barely been able to walk on it due to the pain.   Review of Systems  Positive: Left lower extremity pain. Negative: Denies fever, chest pain, vomiting  Physical Exam   Vitals:   08/27/22 1615  BP: 111/69  Pulse: 96  Resp: 18  SpO2: 98%   Gen:   Awake, no distress   Resp:  Normal effort  MSK:   Moves extremities without difficulty  Other:    Medical Decision Making  Given the patient's initial medical screening exam, the following diagnostic evaluation has been ordered. The patient will be placed in the appropriate treatment space, once one is available, to complete the evaluation and treatment. I have discussed the plan of care with the patient and I have advised the patient that an ED physician or mid-level practitioner will reevaluate their condition after the test results have been received, as the results may give them additional insight into the type of treatment they may need.    Diagnostics: EKG, DVT ultrasound  Treatments: none immediately   Teodoro Spray, [place] 08/27/22 1628

## 2022-08-27 NOTE — ED Notes (Signed)
Pt on call light requesting pain meds

## 2022-08-27 NOTE — Assessment & Plan Note (Signed)
-   We will hydrate with IV normal saline and follow sodium level. ?

## 2022-08-27 NOTE — ED Notes (Signed)
See triage note  Presents with pain to both legs  Pain is worse on the left   No swelling noted   States he had surgery about 3 -4 months ago

## 2022-08-27 NOTE — Assessment & Plan Note (Signed)
-   He was counseled for smoking cessation and will receive further counseling here.

## 2022-08-27 NOTE — Progress Notes (Signed)
The patient has noted abrupt left popliteal artery occlusion.  Patient should be kept n.p.o. at midnight.  We will plan on proceeding with angiogram in the morning.  Full consult to follow. 

## 2022-08-27 NOTE — ED Triage Notes (Signed)
Pt to ER with c/o right leg pain starting 3 days ago.  Pt reports history of bilateral lower extremity thrombectomy approximately 3 months ago.  No swelling noted to legs at this time.  PA-C to triage to assess pt at this time.

## 2022-08-27 NOTE — Assessment & Plan Note (Signed)
-   We will continue statin therapy. 

## 2022-08-27 NOTE — ED Notes (Signed)
Pt on call light again requesting pain meds. Informed pt that this RN has not even had a chance to speak with provider and that the provider will have to see him first. Pt verbalized understanding and apologetic at this time.

## 2022-08-27 NOTE — Assessment & Plan Note (Signed)
-   We will aspirin and statin. - Management otherwise as above. - He may need to be switched to Coumadin to be able to afford it.

## 2022-08-27 NOTE — Assessment & Plan Note (Addendum)
-   The patient will be admitted to a medical telemetry bed. - We will continue him on IV heparin. - We will continue his aspirin. - Pain management will be provided. - Motor function will be followed. - Vascular surgery consult will be obtained. - Dr. Delana Meyer was notified about the patient and is aware. - The patient will be kept n.p.o. after midnight.

## 2022-08-27 NOTE — Assessment & Plan Note (Signed)
We will continue

## 2022-08-27 NOTE — Assessment & Plan Note (Signed)
-   We will continue Flomax and Proscar. 

## 2022-08-27 NOTE — ED Provider Notes (Signed)
[facility] Provider Note   Event Date/Time   First MD Initiated Contact with Patient 08/27/22 1735     (approximate) History  Leg Pain  HPI Vincent [name] is a 61 y.o. male with a stated past medical history of peripheral arterial disease with recent thrombectomy on 03/2022 who presents for 3 days of worsening left foot pain that he describes as 9/10, aching, deep pain that radiates up the left lower extremity.  Patient states that this pain is similar to an occlusion that he had on the right leg earlier this year. ROS: Patient currently denies any vision changes, tinnitus, difficulty speaking, facial droop, sore throat, chest pain, shortness of breath, abdominal pain, nausea/vomiting/diarrhea, dysuria   Physical Exam  Triage Vital Signs: ED Triage Vitals  Enc Vitals Group     BP 08/27/22 1615 111/69     Pulse Rate 08/27/22 1615 96     Resp 08/27/22 1615 18     Temp --      Temp src --      SpO2 08/27/22 1615 98 %     Weight 08/27/22 1626 220 lb (99.8 kg)     Height 08/27/22 1617 6\' 6"  (1.981 m)     Head Circumference --      Peak Flow --      Pain Score 08/27/22 1626 9     Pain Loc --      Pain Edu? --      Excl. in GC? --    Most recent vital signs: Vitals:   08/27/22 1615 08/27/22 1914  BP: 111/69 139/77  Pulse: 96 91  Resp: 18 18  SpO2: 98% 97%   General: Awake, oriented x4. CV:  Good peripheral perfusion.  Resp:  Normal effort.  Abd:  No distention.  Other:  Middle-aged African-American male laying in bed in no acute distress.  Bedside ultrasound with color-flow did not show any flow through the DP or PT arteries of the left foot ED Results / Procedures / Treatments  Labs (all labs ordered are listed, but only abnormal results are displayed) Labs Reviewed  CBC WITH DIFFERENTIAL/PLATELET - Abnormal; Notable for the following components:      Result Value   Lymphs Abs 4.3 (*)    All other components within normal limits  COMPREHENSIVE  METABOLIC PANEL - Abnormal; Notable for the following components:   Sodium 132 (*)    CO2 20 (*)    Glucose, Bld 165 (*)    Calcium 8.3 (*)    All other components within normal limits  PROTIME-INR  APTT  HEPARIN LEVEL (UNFRACTIONATED)  CBC  TYPE AND SCREEN   EKG ED ECG REPORT I, 13/07/23, the attending physician, personally viewed and interpreted this ECG. Date: 08/27/2022 EKG Time: 1632 Rate: 106 Rhythm: Tachycardic sinus rhythm QRS Axis: normal Intervals: normal ST/T Wave abnormalities: normal Narrative Interpretation: Tachycardic sinus rhythm.  No evidence of acute ischemia RADIOLOGY ED MD interpretation: CT angiography of the aorta and bifemoral arteries with runoff shows an abrupt occlusion of the left distal left superficial femoral artery with nonopacification of the left popliteal artery and runoff vessels concerning for thromboembolic disease.  Patient has a patent right common femoral to popliteal bypass graft.  This imaging was independently interpreted by me  Doppler ultrasound of the left lower extremity negative for acute lower extremity DVT however there is suspected occlusive thrombus within the left popliteal artery -Agree with radiology assessment Official radiology report(s): CT ANGIO  AO+BIFEM W & OR WO CONTRAST  Result Date: 08/27/2022 CLINICAL DATA:  Leg ischemia. EXAM: CT ANGIOGRAPHY OF ABDOMINAL AORTA WITH ILIOFEMORAL RUNOFF TECHNIQUE: Multidetector CT imaging of the abdomen, pelvis and lower extremities was performed using the standard protocol during bolus administration of intravenous contrast. Multiplanar CT image reconstructions and MIPs were obtained to evaluate the vascular anatomy. RADIATION DOSE REDUCTION: This exam was performed according to the departmental dose-optimization program which includes automated exposure control, adjustment of the mA and/or kV according to patient size and/or use of iterative reconstruction technique. CONTRAST:   OMNIPAQUE IOHEXOL 350 MG/ML SOLN COMPARISON:  None Available. FINDINGS: VASCULAR Aorta: Normal caliber thoracic aorta with moderate to severe atherosclerotic disease disease. Celiac: Patent without evidence of aneurysm, dissection, vasculitis or significant stenosis. SMA: Patent without evidence of aneurysm, dissection, vasculitis or significant stenosis. Renals: Both renal arteries are patent without evidence of aneurysm, dissection, vasculitis, fibromuscular dysplasia or significant stenosis. IMA: Patent without evidence of aneurysm, dissection, vasculitis or significant stenosis. RIGHT Lower Extremity Inflow: Common and external iliac arteries are patent without evidence of aneurysm, dissection, vasculitis or significant stenosis. Mild-to-moderate multifocal narrowing of the proximal internal iliac artery and complete occlusion of the distal internal right iliac artery. Outflow: Patent common femoral to popliteal artery bypass graft. Occluded native superficial femoral artery. Profunda femoral artery and popliteal artery are patent without evidence of aneurysm, dissection, vasculitis or significant stenosis. Runoff: Two-vessel runoff to the ankle via the posterior tibia and peroneal arteries. Multifocal intermittent occlusion of the anterior tibial artery. LEFT Lower Extremity Inflow: Common and external iliac arteries are patent without evidence of aneurysm, dissection, vasculitis or significant stenosis. Complete occlusion of the left internal iliac artery. Outflow: Common, profunda, and superficial femoral arteries are patent without evidence of aneurysm, dissection, vasculitis or significant stenosis. Severe narrowing of the mid superficial femoral artery on series 4, image 235 and abrupt occlusion of the distal superficial femoral artery seen on series 4, image 60. Non-opacification of the popliteal artery. Runoff: Non opacification of the calf vessels. Veins: No obvious venous abnormality within the  limitations of this arterial phase study. Review of the MIP images confirms the above findings. NON-VASCULAR Lower chest: Small hiatal hernia.  No acute abnormality. Hepatobiliary: No focal liver abnormality is seen. No gallstones, gallbladder wall thickening, or biliary dilatation. Pancreas: Unremarkable. No pancreatic ductal dilatation or surrounding inflammatory changes. Spleen: Normal in size without focal abnormality. Adrenals/Urinary Tract: Adrenal glands are unremarkable. Kidneys are normal, without renal calculi, focal lesion, or hydronephrosis. Bladder is unremarkable. Stomach/Bowel: Stomach is within normal limits. Appendix appears normal. No evidence of bowel wall thickening, distention, or inflammatory changes. Lymphatic: No significant vascular findings are present. No enlarged abdominal or pelvic lymph nodes. Reproductive: Prostate is unremarkable. Other: No abdominal wall hernia or abnormality. No abdominopelvic ascites. Musculoskeletal: No acute or significant osseous findings. IMPRESSION: VASCULAR 1. Abrupt occlusion of the distal left superficial femoral artery with non opacification of the left popliteal artery and runoff vessels, concerning for thromboembolic disease. 2. Patent right common femoral to popliteal bypass graft with two runoff to the right ankle. NON-VASCULAR 1. No acute abnormality in the abdomen or pelvis. Critical Value/emergent results were called by telephone at the time of interpretation on 08/27/2022 at 8:08 pm to provider [facility] , who verbally acknowledged these results. Electronically Signed   By: Allegra Lai M.D.   On: 08/27/2022 20:24   US Venous Img Lower  Left (DVT Study)  Result Date: 08/27/2022 CLINICAL DATA:  Left leg pain  EXAM: Left LOWER EXTREMITY VENOUS DOPPLER ULTRASOUND TECHNIQUE: Gray-scale sonography with compression, as well as color and duplex ultrasound, were performed to evaluate the deep venous system(s) from the level of the common femoral  vein through the popliteal and proximal calf veins. COMPARISON:  03/09/2021 FINDINGS: VENOUS Normal compressibility of the common femoral, superficial femoral, and popliteal veins, as well as the visualized calf veins. Visualized portions of profunda femoral vein and great saphenous vein unremarkable. No filling defects to suggest DVT on grayscale or color Doppler imaging. Doppler waveforms show normal direction of venous flow, normal respiratory plasticity and response to augmentation. Limited views of the contralateral common femoral vein are unremarkable. OTHER Suspected thrombus within the popliteal artery. Limitations: none IMPRESSION: Negative for acute left lower extremity DVT. Suspected occlusive thrombus within the left popliteal artery. Suggest further evaluation with angiography. Critical Value/emergent results were called by telephone at the time of interpretation on 08/27/2022 at 6:10 pm to provider PA Manuela Schwartz for Mardee Postin , who verbally acknowledged these results. Electronically Signed   By: Donavan Foil M.D.   On: 08/27/2022 18:11   PROCEDURES: Critical Care performed: Yes, see critical care procedure note(s) .1-3 Lead EKG Interpretation  Performed by: Naaman Plummer, MD Authorized by: Naaman Plummer, MD     Interpretation: normal     ECG rate:  92   ECG rate assessment: normal     Rhythm: sinus rhythm     Ectopy: none     Conduction: normal   CRITICAL CARE Performed by: Naaman Plummer  Total critical care time: 31 minutes  Critical care time was exclusive of separately billable procedures and treating other patients.  Critical care was necessary to treat or prevent imminent or life-threatening deterioration.  Critical care was time spent personally by me on the following activities: development of treatment plan with patient and/or surrogate as well as nursing, discussions with consultants, evaluation of patient's response to treatment, examination of patient, obtaining  history from patient or surrogate, ordering and performing treatments and interventions, ordering and review of laboratory studies, ordering and review of radiographic studies, pulse oximetry and re-evaluation of patient's condition.  MEDICATIONS ORDERED IN ED: Medications  HYDROcodone-acetaminophen (NORCO/VICODIN) 5-325 MG per tablet 1 tablet (1 tablet Oral Not Given 08/27/22 1841)  heparin ADULT infusion 100 units/mL (25000 units/292mL) (1,700 Units/hr Intravenous Rate/Dose Verify 08/27/22 1915)  HYDROmorphone (DILAUDID) injection 1 mg (has no administration in time range)  HYDROmorphone (DILAUDID) injection 1 mg (1 mg Intravenous Given 08/27/22 1820)  ondansetron (ZOFRAN) injection 4 mg (4 mg Intravenous Given 08/27/22 1822)  heparin bolus via infusion 7,000 Units (7,000 Units Intravenous Bolus from Bag 08/27/22 1910)  0.9 %  sodium chloride infusion ( Intravenous New Bag/Given 08/27/22 1910)  iohexol (OMNIPAQUE) 350 MG/ML injection 125 mL (125 mLs Intravenous Contrast Given 08/27/22 1926)   IMPRESSION / MDM / ASSESSMENT AND PLAN / ED COURSE  I reviewed the triage vital signs and the nursing notes.                             The patient is on the cardiac monitor to evaluate for evidence of arrhythmia and/or significant heart rate changes. Patient's presentation is most consistent with acute presentation with potential threat to life or bodily function.  This patient presents to the ED for concern of left foot pain, this involves an extensive number of treatment options, and is a complaint that carries with it a high  risk of complications and morbidity.  The differential diagnosis includes arterial occlusion, DVT, abscess, gout   Co morbidities that complicate the patient evaluation    Peripheral arterial disease  Additional history obtained:    External records from outside source obtained and reviewed including recent thromboembolectomy at [facility] on 03/2022   Lab Tests:    I Ordered, and  personally interpreted labs.  The pertinent results include: Acute arterial occlusion of the left SFA   Imaging Studies ordered:    I ordered imaging studies including Doppler ultrasound and CT angiography of the aorta with runoff  I independently visualized and interpreted imaging which showed acute arterial occlusion of the left SFA  I agree with the radiologist interpretation   Cardiac Monitoring: / EKG:    The patient was maintained on a cardiac monitor.  I personally viewed and interpreted the cardiac monitored which showed an underlying rhythm of: Sinus tachycardia  Consultations Obtained:    I requested consultation with the [facility] in vascular surgery,  and discussed lab and imaging findings as well as pertinent plan - they recommend: Heparin drip and intervention in the morning   Problem List / ED Course / Critical interventions / Medication management    Acute arterial occlusion of the left SFA  I ordered medication including heparin for arterial occlusion  Reevaluation of the patient after these medicines showed that the patient stayed the same  I have reviewed the patients home medicines and have made adjustments as needed   The patient is on the cardiac monitor to evaluate for evidence of arrhythmia and/or significant heart rate changes.  Patient's presentation is most consistent with acute presentation with potential threat to life or bodily function.  Dispo: Admit to medicine   FINAL CLINICAL IMPRESSION(S) / ED DIAGNOSES   Final diagnoses:  None   Rx / DC Orders   ED Discharge Orders     None      Note:  This document was prepared using Dragon voice recognition software and may include unintentional dictation errors.   Merwyn Katos, MD 08/27/22 2135
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
## 2022-08-28 ENCOUNTER — Encounter
Admission: EM | Disposition: A | Discharge status: Home / Self Care | Payer: Self-pay | Source: Home / Self Care | Attending: Internal Medicine

## 2022-08-28 ENCOUNTER — Encounter: Payer: Self-pay | Admitting: Family Medicine

## 2022-08-28 DIAGNOSIS — I743 Embolism and thrombosis of arteries of the lower extremities: Secondary | ICD-10-CM

## 2022-08-28 DIAGNOSIS — I70201 Unspecified atherosclerosis of native arteries of extremities, right leg: Secondary | ICD-10-CM

## 2022-08-28 DIAGNOSIS — I70202 Unspecified atherosclerosis of native arteries of extremities, left leg: Principal | ICD-10-CM

## 2022-08-28 DIAGNOSIS — I723 Aneurysm of iliac artery: Secondary | ICD-10-CM

## 2022-08-28 DIAGNOSIS — I70222 Atherosclerosis of native arteries of extremities with rest pain, left leg: Secondary | ICD-10-CM

## 2022-08-28 DIAGNOSIS — I700 Atherosclerosis of aorta: Secondary | ICD-10-CM

## 2022-08-28 HISTORY — PX: LOWER EXTREMITY ANGIOGRAPHY: CATH118251

## 2022-08-28 LAB — BASIC METABOLIC PANEL
Anion gap: 7 (ref 5–15)
BUN: 6 mg/dL (ref 6–20)
CO2: 22 mmol/L (ref 22–32)
Calcium: 7.6 mg/dL — ABNORMAL LOW (ref 8.9–10.3)
Chloride: 109 mmol/L (ref 98–111)
Creatinine, Ser: 0.95 mg/dL (ref 0.61–1.24)
GFR, Estimated: >60 mL/min (ref >60)
Glucose, Bld: 135 mg/dL — ABNORMAL HIGH (ref 70–99)
Potassium: 3.6 mmol/L (ref 3.5–5.1)
Sodium: 138 mmol/L (ref 135–145)

## 2022-08-28 LAB — CBC
HCT: 39.4 % (ref 39.0–52.0)
HCT: 40.7 % (ref 39.0–52.0)
HCT: 42.5 % (ref 39.0–52.0)
Hemoglobin: 14 g/dL (ref 13.0–17.0)
Hemoglobin: 14 g/dL (ref 13.0–17.0)
Hemoglobin: 14.4 g/dL (ref 13.0–17.0)
MCH: 31 pg (ref 26.0–34.0)
MCH: 30.4 pg (ref 26.0–34.0)
MCH: 30 pg (ref 26.0–34.0)
MCHC: 35.5 g/dL (ref 30.0–36.0)
MCHC: 34.4 g/dL (ref 30.0–36.0)
MCHC: 33.9 g/dL (ref 30.0–36.0)
MCV: 87.2 fL (ref 80.0–100.0)
MCV: 88.3 fL (ref 80.0–100.0)
MCV: 88.5 fL (ref 80.0–100.0)
Platelets: 173 10*3/uL (ref 150–400)
Platelets: 159 10*3/uL (ref 150–400)
Platelets: 141 10*3/uL — ABNORMAL LOW (ref 150–400)
RBC: 4.52 MIL/uL (ref 4.22–5.81)
RBC: 4.61 MIL/uL (ref 4.22–5.81)
RBC: 4.8 MIL/uL (ref 4.22–5.81)
RDW: 14.4 % (ref 11.5–15.5)
RDW: 14.6 % (ref 11.5–15.5)
RDW: 14.6 % (ref 11.5–15.5)
WBC: 5.1 10*3/uL (ref 4.0–10.5)
WBC: 6.8 10*3/uL (ref 4.0–10.5)
WBC: 6.3 10*3/uL (ref 4.0–10.5)
nRBC: 0 % (ref 0.0–0.2)
nRBC: 0 % (ref 0.0–0.2)
nRBC: 0 % (ref 0.0–0.2)

## 2022-08-28 LAB — HEPARIN LEVEL (UNFRACTIONATED)
Heparin Unfractionated: 1.09 IU/mL — ABNORMAL HIGH (ref 0.30–0.70)
Heparin Unfractionated: 0.43 IU/mL (ref 0.30–0.70)
Heparin Unfractionated: 0.34 IU/mL (ref 0.30–0.70)
Heparin Unfractionated: <0.1 IU/mL — ABNORMAL LOW (ref 0.30–0.70)

## 2022-08-28 LAB — HIV ANTIBODY (ROUTINE TESTING W REFLEX): HIV Screen 4th Generation wRfx: NONREACTIVE

## 2022-08-28 LAB — FIBRINOGEN
Fibrinogen: 275 mg/dL (ref 210–475)
Fibrinogen: 268 mg/dL (ref 210–475)

## 2022-08-28 LAB — GLUCOSE, CAPILLARY
Glucose-Capillary: 165 mg/dL — ABNORMAL HIGH (ref 70–99)
Glucose-Capillary: 159 mg/dL — ABNORMAL HIGH (ref 70–99)
Glucose-Capillary: 141 mg/dL — ABNORMAL HIGH (ref 70–99)

## 2022-08-28 LAB — MRSA NEXT GEN BY PCR, NASAL: MRSA by PCR Next Gen: NOT DETECTED

## 2022-08-28 SURGERY — LOWER EXTREMITY ANGIOGRAPHY
Anesthesia: Moderate Sedation | Laterality: Left

## 2022-08-28 MED ORDER — SODIUM CHLORIDE 0.9 % IV SOLN
1.0000 mg/h | INTRAVENOUS | Status: AC
  Administered 2022-08-28: 1 mg/h
  Filled 2022-08-28: qty 10

## 2022-08-28 MED ORDER — SODIUM CHLORIDE 0.9 % IV SOLN
0.5000 mg/h | INTRAVENOUS | Status: DC
  Administered 2022-08-28: 0.5 mg/h
  Filled 2022-08-28 (×2): qty 10

## 2022-08-28 MED ORDER — HEPARIN (PORCINE) 25000 UT/250ML-% IV SOLN: 400.0000 [IU]/h | INTRAVENOUS | Status: DC

## 2022-08-28 MED ORDER — HYDRALAZINE HCL 20 MG/ML IJ SOLN
INTRAMUSCULAR | Status: AC
  Filled 2022-08-28: qty 1

## 2022-08-28 MED ORDER — SODIUM CHLORIDE FLUSH 0.9 % IV SOLN
INTRAVENOUS | Status: AC
  Filled 2022-08-28: qty 10

## 2022-08-28 MED ORDER — MIDAZOLAM HCL 2 MG/2ML IJ SOLN
INTRAMUSCULAR | Status: DC | PRN
  Administered 2022-08-28: 2 mg via INTRAVENOUS

## 2022-08-28 MED ORDER — MIDAZOLAM HCL 2 MG/2ML IJ SOLN: 1.0000 mg | INTRAMUSCULAR | Status: DC | PRN

## 2022-08-28 MED ORDER — DIPHENHYDRAMINE HCL 50 MG/ML IJ SOLN: 50.0000 mg | Freq: Once | INTRAMUSCULAR | Status: DC | PRN

## 2022-08-28 MED ORDER — IODIXANOL 320 MG/ML IV SOLN
INTRAVENOUS | Status: DC | PRN
  Administered 2022-08-28: 35 mL

## 2022-08-28 MED ORDER — MORPHINE SULFATE (PF) 2 MG/ML IV SOLN
INTRAVENOUS | Status: AC
  Administered 2022-08-28: 5 mg via INTRAVENOUS
  Filled 2022-08-28: qty 3

## 2022-08-28 MED ORDER — HEPARIN SODIUM (PORCINE) 1000 UNIT/ML IJ SOLN
INTRAMUSCULAR | Status: DC | PRN
  Administered 2022-08-28: 4000 [IU] via INTRAVENOUS

## 2022-08-28 MED ORDER — MIDAZOLAM HCL 2 MG/2ML IJ SOLN
INTRAMUSCULAR | Status: AC
  Filled 2022-08-28: qty 2

## 2022-08-28 MED ORDER — ORAL CARE MOUTH RINSE: 15.0000 mL | OROMUCOSAL | Status: DC | PRN

## 2022-08-28 MED ORDER — SODIUM CHLORIDE 0.9 % IV SOLN: 250.0000 mL | INTRAVENOUS | Status: DC | PRN

## 2022-08-28 MED ORDER — SODIUM CHLORIDE 0.9% FLUSH
3.0000 mL | Freq: Two times a day (BID) | INTRAVENOUS | Status: DC
  Administered 2022-08-28 – 2022-08-30 (×3): 3 mL via INTRAVENOUS

## 2022-08-28 MED ORDER — CHLORHEXIDINE GLUCONATE CLOTH 2 % EX PADS
6.0000 | MEDICATED_PAD | Freq: Every day | CUTANEOUS | Status: DC
  Administered 2022-08-28 – 2022-08-30 (×2): 6 via TOPICAL

## 2022-08-28 MED ORDER — HYDROMORPHONE HCL 1 MG/ML IJ SOLN: 1.0000 mg | Freq: Once | INTRAMUSCULAR | Status: DC | PRN

## 2022-08-28 MED ORDER — METHYLPREDNISOLONE SODIUM SUCC 125 MG IJ SOLR: 125.0000 mg | Freq: Once | INTRAMUSCULAR | Status: DC | PRN

## 2022-08-28 MED ORDER — HEPARIN (PORCINE) 25000 UT/250ML-% IV SOLN
400.0000 [IU]/h | INTRAVENOUS | Status: DC
  Administered 2022-08-28: 400 [IU]/h via INTRA_ARTERIAL

## 2022-08-28 MED ORDER — SODIUM CHLORIDE 0.9 % IV SOLN: 0.5000 mg/h | INTRAVENOUS | Status: DC

## 2022-08-28 MED ORDER — SODIUM CHLORIDE 0.9 % IV SOLN: INTRAVENOUS | Status: DC

## 2022-08-28 MED ORDER — FENTANYL CITRATE (PF) 100 MCG/2ML IJ SOLN
INTRAMUSCULAR | Status: DC | PRN
  Administered 2022-08-28 (×2): 50 ug via INTRAVENOUS

## 2022-08-28 MED ORDER — HYDRALAZINE HCL 20 MG/ML IJ SOLN
INTRAMUSCULAR | Status: DC | PRN
  Administered 2022-08-28: 10 mg via INTRAVENOUS

## 2022-08-28 MED ORDER — ONDANSETRON HCL 4 MG/2ML IJ SOLN: 4.0000 mg | Freq: Four times a day (QID) | INTRAMUSCULAR | Status: DC | PRN

## 2022-08-28 MED ORDER — SODIUM CHLORIDE 0.9% FLUSH: 3.0000 mL | INTRAVENOUS | Status: DC | PRN

## 2022-08-28 MED ORDER — FENTANYL CITRATE (PF) 100 MCG/2ML IJ SOLN
INTRAMUSCULAR | Status: AC
  Filled 2022-08-28: qty 2

## 2022-08-28 MED ORDER — FAMOTIDINE 20 MG PO TABS: 40.0000 mg | ORAL_TABLET | Freq: Once | ORAL | Status: DC | PRN

## 2022-08-28 MED ORDER — MORPHINE SULFATE (PF) 4 MG/ML IV SOLN
5.0000 mg | INTRAVENOUS | Status: DC | PRN
  Administered 2022-08-28 – 2022-08-29 (×2): 5 mg via INTRAVENOUS
  Filled 2022-08-28 (×2): qty 2

## 2022-08-28 MED ORDER — CEFAZOLIN SODIUM-DEXTROSE 2-4 GM/100ML-% IV SOLN: 2.0000 g | INTRAVENOUS | Status: AC

## 2022-08-28 MED ORDER — HYDRALAZINE HCL 20 MG/ML IJ SOLN: 5.0000 mg | INTRAMUSCULAR | Status: DC | PRN

## 2022-08-28 MED ORDER — HEPARIN SODIUM (PORCINE) 1000 UNIT/ML IJ SOLN
INTRAMUSCULAR | Status: AC
  Filled 2022-08-28: qty 10

## 2022-08-28 MED ORDER — CEFAZOLIN SODIUM-DEXTROSE 2-4 GM/100ML-% IV SOLN
INTRAVENOUS | Status: AC
  Administered 2022-08-28: 2 g via INTRAVENOUS
  Filled 2022-08-28: qty 100

## 2022-08-28 MED ORDER — LABETALOL HCL 5 MG/ML IV SOLN
10.0000 mg | INTRAVENOUS | Status: DC | PRN
  Administered 2022-08-28 (×3): 10 mg via INTRAVENOUS
  Filled 2022-08-28 (×2): qty 4

## 2022-08-28 MED ORDER — MIDAZOLAM HCL 2 MG/ML PO SYRP: 8.0000 mg | ORAL_SOLUTION | Freq: Once | ORAL | Status: DC | PRN

## 2022-08-28 MED ORDER — HEPARIN (PORCINE) 25000 UT/250ML-% IV SOLN
1300.0000 [IU]/h | INTRAVENOUS | Status: DC
  Administered 2022-08-28 (×2): 1300 [IU]/h via INTRAVENOUS
  Filled 2022-08-28: qty 250

## 2022-08-28 SURGICAL SUPPLY — 26 items
CATH ANGIO 5F PIGTAIL 65CM (CATHETERS) IMPLANT
CATH BEACON 5 .038 100 VERT TP (CATHETERS) IMPLANT
CATH INFUS 135CMX30CM (CATHETERS) IMPLANT
CATH ROTAREX 135 6FR (CATHETERS) IMPLANT
CATH SEEKER .035X150CM (CATHETERS) IMPLANT
CATH VERT 5FR 125CM (CATHETERS) IMPLANT
COVER PROBE ULTRASOUND 5X96 (MISCELLANEOUS) IMPLANT
DRAPE C-ARM 35X43 STRL (DRAPES) IMPLANT
GLIDEWIRE ADV .035X260CM (WIRE) IMPLANT
GOWN STRL REUS W/ TWL LRG LVL3 (GOWN DISPOSABLE) ×1 IMPLANT
GOWN STRL REUS W/TWL LRG LVL3 (GOWN DISPOSABLE) ×1
GUIDEWIRE SUPER STIFF .035X180 (WIRE) IMPLANT
KIT CV MULTILUMEN 7FR 20 (SET/KITS/TRAYS/PACK) ×1
KIT CV MULTILUMEN 7FR 20 SUB (SET/KITS/TRAYS/PACK) IMPLANT
KIT ENCORE 26 ADVANTAGE (KITS) IMPLANT
NDL ENTRY 21GA 7CM ECHOTIP (NEEDLE) IMPLANT
NEEDLE ENTRY 21GA 7CM ECHOTIP (NEEDLE) ×1 IMPLANT
PACK ANGIOGRAPHY (CUSTOM PROCEDURE TRAY) ×1 IMPLANT
SET INTRO CAPELLA COAXIAL (SET/KITS/TRAYS/PACK) IMPLANT
SHEATH BRITE TIP 5FRX11 (SHEATH) IMPLANT
SHEATH PINNACLE ST 7F 65CM (SHEATH) IMPLANT
SUT SILK 0 FSL (SUTURE) IMPLANT
SYR MEDRAD MARK 7 150ML (SYRINGE) IMPLANT
TUBING CONTRAST HIGH PRESS 72 (TUBING) IMPLANT
WIRE G V18X300CM (WIRE) IMPLANT
WIRE GUIDERIGHT .035X150 (WIRE) IMPLANT

## 2022-08-28 NOTE — Consult Note (Signed)
ANTICOAGULATION CONSULT NOTE  Pharmacy Consult for heparin infusion Indication: DVT  No Known Allergies  Patient Measurements: Height: 6\' 6"  (198.1 cm) Weight: 99.8 kg (220 lb 0.3 oz) IBW/kg (Calculated) : 91.4 Heparin Dosing Weight: 99.8kg  Vital Signs: Temp: 99.2 F (37.3 C) (11/08 2000) Temp Source: Oral (11/08 2000) BP: 169/87 (11/08 2000) Pulse Rate: 94 (11/08 2000)  Labs: Recent Labs    08/27/22 1825 08/27/22 1904 08/28/22 0138 08/28/22 0517 08/28/22 1013 08/28/22 1441 08/28/22 1550 08/28/22 2007  HGB 14.9  --   --  14.0  --  14.0  --  14.4  HCT 42.5  --   --  39.4  --  40.7  --  42.5  PLT 170  --   --  173  --  159  --  141*  APTT  --  28  --   --   --   --   --   --   LABPROT 14.0  --   --   --   --   --   --   --   INR 1.1  --   --   --   --   --   --   --   HEPARINUNFRC  --   --    < >  --  0.43  --  0.34 <0.10*  CREATININE 1.04  --   --  0.95  --   --   --   --    < > = values in this interval not displayed.     Estimated Creatinine Clearance: 106.9 mL/min (by C-G formula based on SCr of 0.95 mg/dL).   Medical History: Past Medical History:  Diagnosis Date   Chronic kidney disease 07/27/2015   Noted at 07/27/15 [facility] visit as Stage 2   Gout    Hepatitis 11/28/2015   positive for Hep C virus antibody    Medications:  PTA: N/A Inpatient: Heparin infusion (11/7 >>>) Allergies: NKDA  Assessment: 61 year old male with PMH atrial fibrillation, tobacco use, alcohol use, and CKD presents to ED with c/o right leg pain starting 3 days ago.67 Upon physical examination, left foot cold to the touch and unable to find pedal pulse. Right foot warm to touch and weak pedal pulse palpated. Venous doppler study suspicious for occlusive thrombus within the left popliteal artery.  Goal of Therapy:  Heparin level 0.2-0.5 units/ml Monitor platelets by anticoagulation protocol: Yes  Date Time aPTT/HL Rate/Comment  11/8 0138 1.09                1700  units/hr >> SUPRAtherapeutic 11/8 1013 0.43                Therapeutic x 1 @ 1300 units/hr 11/8 2007 <0.10  subtherapeutic  Plan:  Per Dr. 2008, continue heparin infusion via intra-arterial line post-aortogram at 400 units/hr without adjustment Will recheck HL every 6 hours x 24 hours per vascular team lab orders  Gilda Crease Clinical Pharmacist 08/28/2022 9:43 PM

## 2022-08-28 NOTE — Op Note (Signed)
[facility]  Percutaneous Study/Intervention Procedural Note   Date of Surgery: 08/28/2022,3:19 PM  Surgeon:Katlynne Mckercher, Latina Craver   Pre-operative Diagnosis: Ischemia left lower extremity; atherosclerotic occlusive disease bilateral lower extremities with recurrent rest pain of the left lower extremity  Post-operative diagnosis:  Same  Procedure(s) Performed:  1.  Abdominal aortogram  2.  Left lower extremity distal runoff third order catheter placement with additional third order  3.  Mechanical thrombectomy of the SFA and popliteal on the left  4.  Initiation of tPA with continuous thrombolysis  5.  Insertion of a triple-lumen catheter with ultrasound guidance right common femoral vein   Anesthesia: Conscious sedation was administered by the interventional radiology RN under my direct supervision. IV Versed plus fentanyl were utilized. Continuous ECG, pulse oximetry and blood pressure was monitored throughout the entire procedure. Conscious sedation was administered for a total of 1 hour 18 minutes and 48 seconds.  Sheath: 7 [place] destination sheath right femoral retrograde  Contrast: 35 cc   Fluoroscopy Time: 2.5 minutes  Indications: Patient presented to the emergency room ischemia of the right lower extremity.  CT angiogram verified the occlusion of the distal SFA popliteal.  He has been brought to angiography with the hope for intervention for limb salvage.  Risks and benefits of been reviewed all questions answered patient agrees to proceed  Procedure:  Avion Kutzer Enochis a 61 y.o. male who was identified and appropriate procedural time out was performed.  The patient was then placed supine on the table and prepped and draped in the usual sterile fashion.  Ultrasound was used to evaluate the right common femoral artery.  It was echolucent and pulsatile indicating it is patent .  An ultrasound image was acquired for the permanent record.  A micropuncture needle  was used to access the right common femoral artery under direct ultrasound guidance.  The microwire was then advanced under fluoroscopic guidance without difficulty followed by the micro-sheath.  A 0.035 J wire was advanced without resistance and a 5Fr sheath was placed.    Pigtail catheter was advanced over the J-wire and positioned to the level of T12.  AP projection of the aorta was then obtained.  After review of this image pigtail catheter was repositioned to above the bifurcation and an RAO image of the pelvis.  Using an advantage wire and the pigtail catheter the aortic bifurcation was crossed and the catheter was advanced to the distal external iliac artery on the left.  An LAO projection of the femoral bifurcation was obtained.  The catheter was then negotiated into the SFA over wire and distal runoff was obtained.  4000 units heparin was given after review of the images.  A combination of the advantage wire and a KMP catheter as well as a seeker catheter was used to first select the anterior tibia where hand-injection demonstrated diffuse critical disease throughout the distal and mid anterior tibial.  Catheter was then pulled back and using the wire was negotiated into the peroneal where again in the mid and distal two thirds of the peroneal there is diffuse greater than 90% stenosis.  The [place] Rex catheter was delivered onto the field and 2 passes were made through the distal SFA and popliteal artery down to the approximate level of the anterior tibial takeoff.  Follow-up imaging demonstrated the channel have been created to allow for better delivery of the tPA.  An infusion catheter with a 3 cm infusion length was then advanced over the  wire positioned so that its distal marker was in the peroneal and the proximal marker was in the SFA just proximal to the occlusion.  Attention was then turned to central line insertion.  The ultrasound was delivered back to the field and placed in a sterile  sleeve.  The right common femoral vein was echolucent and compressible indicating patency.  Images recorded for the record.  1% lidocaine is infiltrated in soft tissues and then a microneedle was inserted into the common femoral vein and a single pass.  Microwire followed by microsheath.  J-wire followed by the dilator and then a triple-lumen catheter.  All 3 lm were aspirated and flushed with heparinized saline.  The catheter was secured to the skin of the thigh with 2-0 silk suture.  The arterial sheath and infusion catheter was secured to the thigh with 0 silk as well as 2-0 silk and then Steri-Strips and Tegaderm dressing.  Findings:   Aortogram: Abdominal aorta demonstrates diffuse atherosclerotic changes but there are no hemodynamically significant stenoses.  The aortic bifurcation is patent.  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  There is some common iliac artery ectasia on the right.  External iliac artery is diffusely diseased but no hemodynamically significant stenosis.  Right Lower Extremity: The mid right common femoral and visualized portions of the profunda femoris and superficial femoral are patent.  There are no hemodynamically significant stenosis.  Left Lower Extremity: The left common femoral and profunda femoris are widely patent.  Mild to moderate atherosclerotic changes but it does not appear to be a hemodynamically significant stenosis.  The SFA demonstrates very sluggish flow with proximal two thirds with increasing atherosclerotic changes.  Just proximal to Hunter's canal it occludes.  Remains occluded for the rest of its course as well as throughout the entire length of the popliteal as well as the trifurcation.  There is nonvisualization of the distal tibial vessels until admitted to select at which point distal two thirds of the anterior tibial demonstrate greater than 90% diffuse stenosis throughout the course as does the peroneal.   Posterior tibial remains not visualized throughout.  Patient will be returned to the lab tomorrow after thrombolysis overnight    Disposition: Patient was taken to the recovery room in stable condition having tolerated the procedure well.  Belenda Cruise Rahkim Rabalais 08/28/2022,3:19 PM

## 2022-08-28 NOTE — Progress Notes (Signed)
PROGRESS NOTE    Vincent [name]  UDJ:[id number] DOB: 22-May-1961 DOA: 08/27/2022 PCP: Pcp, No    Brief Narrative:  61 y.o. African-American male with medical history significant for stage II chronic kidney disease, Type II diabetes mellitus, peripheral neuropathy, dyslipidemia, BPH, gout and hepatitis C as well as PAD s/p recent thrombectomy on the right lower extremity in June 2023,, who presented to the emergency room with acute onset of worsening left foot pain over the last 3 days that he graded 9/10 in severity and described as aching deep pain radiating up the left lower extremity.  He stated that it was similar to the occlusion he had earlier this year in the right leg.  No headache or dizziness or blurred vision.  No nausea or vomiting or abdominal pain.  No dyspnea or cough or wheezing.  No chest pain or palpitations.  No dysuria, oliguria or hematuria or flank pain.  No other bleeding diathesis.  The patient admitted that he missed 3 days of Xarelto over the last week as he could not afford it.   Vascular consulted.  N.p.o. for angiography on 11/8.  Patient remains on heparin gtt.  Assessment & Plan:   Principal Problem:   Critical limb ischemia of left lower extremity (HCC) Active Problems:   Type 2 diabetes mellitus with peripheral neuropathy (HCC)   Tobacco abuse   Hyponatremia   Dyslipidemia   Peripheral arterial disease (HCC)   BPH (benign prostatic hyperplasia)   Gout   Femoral artery occlusion, left (HCC)  * Critical limb ischemia of left lower extremity (HCC) Suspected etiology is nonadherence to prescribed anticoagulant.  Patient states he was on Xarelto but stopped taking because he could not afford.  Imaging on arrival concerning for abrupt occlusion of popliteal artery on left lower extremity. Plan: Continue heparin GTT Continue aspirin N.p.o. for procedure Vascular planning angiography today Multimodal pain control We will consult TOC for medication management  assistance  Type 2 diabetes mellitus with peripheral neuropathy (HCC) Hold home metformin SSI Accu-Cheks before meals and at bedtime Continue Neurontin and Cymbalta  Tobacco abuse Counseling Offered nicotine patch   Peripheral arterial disease (HCC) PTA aspirin and statin We will reach out to [facility] for medication management assistance   Dyslipidemia PTA statin   Hyponatremia Unclear etiology.  Suspect intravascular volume depletion.  On IV fluids while n.p.o.   Gout PTA allopurinol   BPH (benign prostatic hyperplasia) PTA Flomax and Proscar  DVT prophylaxis: IV heparin Code Status: Full code Family Communication: None today Disposition Plan: Status is: Inpatient Remains inpatient appropriate because: Critical limb ischemia on IV heparin, needing angiography today with vascular surgery.   Level of care: Telemetry Medical  Consultants:  Vascular surgery  Procedures:  Left lower extremity angiogram 11/8  Antimicrobials: None   Subjective: Seen and examined.  In some pain from left leg.  Objective: Vitals:   08/28/22 1000 08/28/22 1015 08/28/22 1030 08/28/22 1124  BP: (!) 146/65  (!) 146/73 (!) 142/103  Pulse: 78 75 77 73  Resp:    17  Temp:    97.8 F (36.6 C)  TempSrc:    Oral  SpO2:    94%  Weight:    99.8 kg  Height:    6\' 6"  (1.981 m)   No intake or output data in the 24 hours ending 08/28/22 1250 Filed Weights   08/27/22 1626 08/28/22 1124  Weight: 99.8 kg 99.8 kg    Examination:  General exam: Mild distress due to  pain.  Mildly diaphoretic Respiratory system: Lungs clear.  Normal breathing.  Room air Cardiovascular system: S1-S2, tachycardic, regular rhythm, no murmurs Gastrointestinal system: Soft, NT/ND, normal bowel sounds Central nervous system: Alert and oriented. No focal neurological deficits. Extremities: Nonpalpable left-sided DP and PT pulses Skin: No rashes, lesions or ulcers Psychiatry: Judgement and insight appear normal.  Mood & affect appropriate.     Data Reviewed: I have personally reviewed following labs and imaging studies  CBC: Recent Labs  Lab 08/27/22 1825 08/28/22 0517  WBC 6.6 5.1  NEUTROABS 1.7  --   HGB 14.9 14.0  HCT 42.5 39.4  MCV 86.6 87.2  PLT 170 173   Basic Metabolic Panel: Recent Labs  Lab 08/27/22 1825 08/28/22 0517  NA 132* 138  K 3.7 3.6  CL 100 109  CO2 20* 22  GLUCOSE 165* 135*  BUN 6 6  CREATININE 1.04 0.95  CALCIUM 8.3* 7.6*   GFR: Estimated Creatinine Clearance: 106.9 mL/min (by C-G formula based on SCr of 0.95 mg/dL). Liver Function Tests: Recent Labs  Lab 08/27/22 1825  AST 35  ALT 26  ALKPHOS 87  BILITOT 0.9  PROT 7.3  ALBUMIN 3.6   No results for input(s): "LIPASE", "AMYLASE" in the last 168 hours. No results for input(s): "AMMONIA" in the last 168 hours. Coagulation Profile: Recent Labs  Lab 08/27/22 1825  INR 1.1   Cardiac Enzymes: No results for input(s): "CKTOTAL", "CKMB", "CKMBINDEX", "TROPONINI" in the last 168 hours. BNP (last 3 results) No results for input(s): "PROBNP" in the last 8760 hours. HbA1C: No results for input(s): "HGBA1C" in the last 72 hours. CBG: No results for input(s): "GLUCAP" in the last 168 hours. Lipid Profile: No results for input(s): "CHOL", "HDL", "LDLCALC", "TRIG", "CHOLHDL", "LDLDIRECT" in the last 72 hours. Thyroid Function Tests: No results for input(s): "TSH", "T4TOTAL", "FREET4", "T3FREE", "THYROIDAB" in the last 72 hours. Anemia Panel: No results for input(s): "VITAMINB12", "FOLATE", "FERRITIN", "TIBC", "IRON", "RETICCTPCT" in the last 72 hours. Sepsis Labs: No results for input(s): "PROCALCITON", "LATICACIDVEN" in the last 168 hours.  No results found for this or any previous visit (from the past 240 hour(s)).       Radiology Studies: CT ANGIO AO+BIFEM W & OR WO CONTRAST  Result Date: 08/27/2022 CLINICAL DATA:  Leg ischemia. EXAM: CT ANGIOGRAPHY OF ABDOMINAL AORTA WITH ILIOFEMORAL RUNOFF  TECHNIQUE: Multidetector CT imaging of the abdomen, pelvis and lower extremities was performed using the standard protocol during bolus administration of intravenous contrast. Multiplanar CT image reconstructions and MIPs were obtained to evaluate the vascular anatomy. RADIATION DOSE REDUCTION: This exam was performed according to the departmental dose-optimization program which includes automated exposure control, adjustment of the mA and/or kV according to patient size and/or use of iterative reconstruction technique. CONTRAST:  OMNIPAQUE IOHEXOL 350 MG/ML SOLN COMPARISON:  None Available. FINDINGS: VASCULAR Aorta: Normal caliber thoracic aorta with moderate to severe atherosclerotic disease disease. Celiac: Patent without evidence of aneurysm, dissection, vasculitis or significant stenosis. SMA: Patent without evidence of aneurysm, dissection, vasculitis or significant stenosis. Renals: Both renal arteries are patent without evidence of aneurysm, dissection, vasculitis, fibromuscular dysplasia or significant stenosis. IMA: Patent without evidence of aneurysm, dissection, vasculitis or significant stenosis. RIGHT Lower Extremity Inflow: Common and external iliac arteries are patent without evidence of aneurysm, dissection, vasculitis or significant stenosis. Mild-to-moderate multifocal narrowing of the proximal internal iliac artery and complete occlusion of the distal internal right iliac artery. Outflow: Patent common femoral to popliteal artery bypass graft. Occluded native  superficial femoral artery. Profunda femoral artery and popliteal artery are patent without evidence of aneurysm, dissection, vasculitis or significant stenosis. Runoff: Two-vessel runoff to the ankle via the posterior tibia and peroneal arteries. Multifocal intermittent occlusion of the anterior tibial artery. LEFT Lower Extremity Inflow: Common and external iliac arteries are patent without evidence of aneurysm, dissection,  vasculitis or significant stenosis. Complete occlusion of the left internal iliac artery. Outflow: Common, profunda, and superficial femoral arteries are patent without evidence of aneurysm, dissection, vasculitis or significant stenosis. Severe narrowing of the mid superficial femoral artery on series 4, image 235 and abrupt occlusion of the distal superficial femoral artery seen on series 4, image 60. Non-opacification of the popliteal artery. Runoff: Non opacification of the calf vessels. Veins: No obvious venous abnormality within the limitations of this arterial phase study. Review of the MIP images confirms the above findings. NON-VASCULAR Lower chest: Small hiatal hernia.  No acute abnormality. Hepatobiliary: No focal liver abnormality is seen. No gallstones, gallbladder wall thickening, or biliary dilatation. Pancreas: Unremarkable. No pancreatic ductal dilatation or surrounding inflammatory changes. Spleen: Normal in size without focal abnormality. Adrenals/Urinary Tract: Adrenal glands are unremarkable. Kidneys are normal, without renal calculi, focal lesion, or hydronephrosis. Bladder is unremarkable. Stomach/Bowel: Stomach is within normal limits. Appendix appears normal. No evidence of bowel wall thickening, distention, or inflammatory changes. Lymphatic: No significant vascular findings are present. No enlarged abdominal or pelvic lymph nodes. Reproductive: Prostate is unremarkable. Other: No abdominal wall hernia or abnormality. No abdominopelvic ascites. Musculoskeletal: No acute or significant osseous findings. IMPRESSION: VASCULAR 1. Abrupt occlusion of the distal left superficial femoral artery with non opacification of the left popliteal artery and runoff vessels, concerning for thromboembolic disease. 2. Patent right common femoral to popliteal bypass graft with two runoff to the right ankle. NON-VASCULAR 1. No acute abnormality in the abdomen or pelvis. Critical Value/emergent results were  called by telephone at the time of interpretation on 08/27/2022 at 8:08 pm to provider [facility] , who verbally acknowledged these results. Electronically Signed   By: Allegra Lai M.D.   On: 08/27/2022 20:24   US Venous Img Lower  Left (DVT Study)  Result Date: 08/27/2022 CLINICAL DATA:  Left leg pain EXAM: Left LOWER EXTREMITY VENOUS DOPPLER ULTRASOUND TECHNIQUE: Gray-scale sonography with compression, as well as color and duplex ultrasound, were performed to evaluate the deep venous system(s) from the level of the common femoral vein through the popliteal and proximal calf veins. COMPARISON:  03/09/2021 FINDINGS: VENOUS Normal compressibility of the common femoral, superficial femoral, and popliteal veins, as well as the visualized calf veins. Visualized portions of profunda femoral vein and great saphenous vein unremarkable. No filling defects to suggest DVT on grayscale or color Doppler imaging. Doppler waveforms show normal direction of venous flow, normal respiratory plasticity and response to augmentation. Limited views of the contralateral common femoral vein are unremarkable. OTHER Suspected thrombus within the popliteal artery. Limitations: none IMPRESSION: Negative for acute left lower extremity DVT. Suspected occlusive thrombus within the left popliteal artery. Suggest further evaluation with angiography. Critical Value/emergent results were called by telephone at the time of interpretation on 08/27/2022 at 6:10 pm to provider PA Darl Pikes for [facility] , who verbally acknowledged these results. Electronically Signed   By: Jasmine Pang M.D.   On: 08/27/2022 18:11        Scheduled Meds:  [MAR Hold] HYDROcodone-acetaminophen  1 tablet Oral Once   Continuous Infusions:  sodium chloride 100 mL/hr at 08/28/22 1013   sodium  chloride      ceFAZolin (ANCEF) IV 2 g (08/28/22 1248)   heparin Stopped (08/28/22 1240)     LOS: 1 day      Tresa Moore, MD [facility]   If 7PM-7AM, please contact night-coverage  08/28/2022, 12:50 PM

## 2022-08-28 NOTE — Consult Note (Signed)
ANTICOAGULATION CONSULT NOTE  Pharmacy Consult for heparin infusion Indication: DVT  No Known Allergies  Patient Measurements: Height: 6\' 6"  (198.1 cm) Weight: 99.8 kg (220 lb) IBW/kg (Calculated) : 91.4 Heparin Dosing Weight: 99.8kg  Vital Signs: Temp: 98.2 F (36.8 C) (11/08 0938) Temp Source: Oral (11/08 0938) BP: 146/73 (11/08 1030) Pulse Rate: 77 (11/08 1030)  Labs: Recent Labs    08/27/22 1825 08/27/22 1904 08/28/22 0138 08/28/22 0517 08/28/22 1013  HGB 14.9  --   --  14.0  --   HCT 42.5  --   --  39.4  --   PLT 170  --   --  173  --   APTT  --  28  --   --   --   LABPROT 14.0  --   --   --   --   INR 1.1  --   --   --   --   HEPARINUNFRC  --   --  1.09*  --  0.43  CREATININE 1.04  --   --  0.95  --      Estimated Creatinine Clearance: 106.9 mL/min (by C-G formula based on SCr of 0.95 mg/dL).   Medical History: Past Medical History:  Diagnosis Date   Chronic kidney disease 07/27/2015   Noted at 07/27/15 [facility] visit as Stage 2   Gout    Hepatitis 11/28/2015   positive for Hep C virus antibody    Medications:  PTA: N/A Inpatient: Heparin infusion (11/7 >>>) Allergies: NKDA  Assessment: 61 year old male with PMH atrial fibrillation, tobacco use, alcohol use, and CKD presents to ED with c/o right leg pain starting 3 days ago.67 Upon physical examination, left foot cold to the touch and unable to find pedal pulse. Right foot warm to touch and weak pedal pulse palpated. Venous doppler study suspicious for occlusive thrombus within the left popliteal artery.  Goal of Therapy:  Heparin level 0.3-0.7 units/ml Monitor platelets by anticoagulation protocol: Yes  Date Time aPTT/HL Rate/Comment  11/8      0138    1.09                1700 units/hr >> SUPRAtherapeutic 11/8  1013  0.43                Therapeutic x 1 @ 1300 units/hr  Plan:  11/8:  HL @ 1013 = 0.43, therapeutic  Will continue heparin drip @ 1300 units/hr. Will recheck HL in 6 hrs    13/8 Clinical Pharmacist 08/28/2022 11:24 AM

## 2022-08-28 NOTE — Consult Note (Signed)
ANTICOAGULATION CONSULT NOTE - Initial Consult  Pharmacy Consult for heparin infusion Indication: DVT  No Known Allergies  Patient Measurements: Height: 6\' 6"  (198.1 cm) Weight: 99.8 kg (220 lb) IBW/kg (Calculated) : 91.4 Heparin Dosing Weight: 99.8kg  Vital Signs: Temp: 98.2 F (36.8 C) (11/07 2305) Temp Source: Oral (11/07 2305) BP: 128/88 (11/08 0125) Pulse Rate: 94 (11/08 0125)  Labs: Recent Labs    08/27/22 1825 08/27/22 1904 08/28/22 0138  HGB 14.9  --   --   HCT 42.5  --   --   PLT 170  --   --   APTT  --  28  --   LABPROT 14.0  --   --   INR 1.1  --   --   HEPARINUNFRC  --   --  1.09*  CREATININE 1.04  --   --     Estimated Creatinine Clearance: 97.6 mL/min (by C-G formula based on SCr of 1.04 mg/dL).   Medical History: Past Medical History:  Diagnosis Date   Chronic kidney disease 07/27/2015   Noted at 07/27/15 [facility] visit as Stage 2   Gout    Hepatitis 11/28/2015   positive for Hep C virus antibody    Medications:  PTA: N/A Inpatient: Heparin infusion (11/7 >>>) Allergies: NKDA  Assessment: 61 year old male with PMH atrial fibrillation, tobacco use, alcohol use, and CKD presents to ED with c/o right leg pain starting 3 days ago.67 Upon physical examination, left foot cold to the touch and unable to find pedal pulse. Right foot warm to touch and weak pedal pulse palpated. Venous doppler study suspicious for occlusive thrombus within the left popliteal artery.  Goal of Therapy:  Heparin level 0.3-0.7 units/ml Monitor platelets by anticoagulation protocol: Yes  Date Time aPTT/HL Rate/Comment  11/8      0138    1.09                1700 units/hr >> SUPRAtherapeutic   Plan:  11/8:  HL @ 0138 = 1.09, SUPRAtherapeutic  Will hold heparin drip for 1 hr and restart @ 1300 units/hr. Will recheck HL 6 hrs after restart.   Graysyn Bache D Clinical Pharmacist 08/28/2022 2:53 AM

## 2022-08-28 NOTE — Progress Notes (Signed)
All ordered labs drawn and sent to lab. Attempted to place foley catheter 16 FR unsuccessfully with 2 RN assist. Pt. Able to urinate 450 ml amber clear UOP after attempt to place foley catheter.
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
## 2022-08-28 NOTE — ED Notes (Signed)
This RN gave report to Specials, specials state they will get consent closer to time of procedure because pt. Just received ordered, prn dilaudid. Pt. Is alert and oriented x4. Pt. Undressed for procedure.

## 2022-08-28 NOTE — Interval H&P Note (Signed)
History and Physical Interval Note:  08/28/2022 2:31 PM  Vincent [name]  has presented today for surgery, with the diagnosis of Critical limb ischemia.  The various methods of treatment have been discussed with the patient and family. After consideration of risks, benefits and other options for treatment, the patient has consented to  Procedure(s): Lower Extremity Angiography (Left) as a surgical intervention.  The patient's history has been reviewed, patient examined, no change in status, stable for surgery.  I have reviewed the patient's chart and labs.  Questions were answered to the patient's satisfaction.     Levora Dredge

## 2022-08-29 ENCOUNTER — Encounter: Payer: Self-pay | Admitting: Vascular Surgery

## 2022-08-29 ENCOUNTER — Encounter
Admission: EM | Disposition: A | Discharge status: Home / Self Care | Payer: Self-pay | Source: Home / Self Care | Attending: Internal Medicine

## 2022-08-29 DIAGNOSIS — Z9889 Other specified postprocedural states: Secondary | ICD-10-CM

## 2022-08-29 HISTORY — PX: LOWER EXTREMITY ANGIOGRAPHY: CATH118251

## 2022-08-29 LAB — BASIC METABOLIC PANEL
Anion gap: 6 (ref 5–15)
BUN: 7 mg/dL (ref 6–20)
CO2: 25 mmol/L (ref 22–32)
Calcium: 8 mg/dL — ABNORMAL LOW (ref 8.9–10.3)
Chloride: 107 mmol/L (ref 98–111)
Creatinine, Ser: 1.01 mg/dL (ref 0.61–1.24)
GFR, Estimated: >60 mL/min (ref >60)
Glucose, Bld: 153 mg/dL — ABNORMAL HIGH (ref 70–99)
Potassium: 4 mmol/L (ref 3.5–5.1)
Sodium: 138 mmol/L (ref 135–145)

## 2022-08-29 LAB — GLUCOSE, CAPILLARY
Glucose-Capillary: 151 mg/dL — ABNORMAL HIGH (ref 70–99)
Glucose-Capillary: 154 mg/dL — ABNORMAL HIGH (ref 70–99)
Glucose-Capillary: 145 mg/dL — ABNORMAL HIGH (ref 70–99)
Glucose-Capillary: 154 mg/dL — ABNORMAL HIGH (ref 70–99)
Glucose-Capillary: 161 mg/dL — ABNORMAL HIGH (ref 70–99)

## 2022-08-29 LAB — CBC
HCT: 40.3 % (ref 39.0–52.0)
Hemoglobin: 13.7 g/dL (ref 13.0–17.0)
MCH: 30.4 pg (ref 26.0–34.0)
MCHC: 34 g/dL (ref 30.0–36.0)
MCV: 89.4 fL (ref 80.0–100.0)
Platelets: 115 10*3/uL — ABNORMAL LOW (ref 150–400)
RBC: 4.51 MIL/uL (ref 4.22–5.81)
RDW: 14.7 % (ref 11.5–15.5)
WBC: 5.4 10*3/uL (ref 4.0–10.5)
nRBC: 0 % (ref 0.0–0.2)

## 2022-08-29 LAB — FIBRINOGEN: Fibrinogen: 237 mg/dL (ref 210–475)

## 2022-08-29 LAB — HEMOGLOBIN A1C
Hgb A1c MFr Bld: 7.1 % — ABNORMAL HIGH (ref 4.8–5.6)
Mean Plasma Glucose: 157.07 mg/dL

## 2022-08-29 LAB — HEPARIN LEVEL (UNFRACTIONATED): Heparin Unfractionated: <0.1 IU/mL — ABNORMAL LOW (ref 0.30–0.70)

## 2022-08-29 SURGERY — LOWER EXTREMITY ANGIOGRAPHY
Anesthesia: Moderate Sedation | Laterality: Left

## 2022-08-29 MED ORDER — DULOXETINE HCL 30 MG PO CPEP
30.0000 mg | ORAL_CAPSULE | Freq: Every day | ORAL | Status: DC
  Administered 2022-08-29 – 2022-08-30 (×2): 30 mg via ORAL
  Filled 2022-08-29 (×2): qty 1

## 2022-08-29 MED ORDER — MIDAZOLAM HCL 2 MG/ML PO SYRP: 8.0000 mg | ORAL_SOLUTION | Freq: Once | ORAL | Status: DC | PRN

## 2022-08-29 MED ORDER — ONDANSETRON HCL 4 MG/2ML IJ SOLN: 4.0000 mg | Freq: Four times a day (QID) | INTRAMUSCULAR | Status: DC | PRN

## 2022-08-29 MED ORDER — NITROGLYCERIN 1 MG/10 ML FOR IR/CATH LAB
INTRA_ARTERIAL | Status: AC
  Filled 2022-08-29: qty 10

## 2022-08-29 MED ORDER — HEPARIN SODIUM (PORCINE) 1000 UNIT/ML IJ SOLN
INTRAMUSCULAR | Status: AC
  Filled 2022-08-29: qty 10

## 2022-08-29 MED ORDER — GABAPENTIN 300 MG PO CAPS
300.0000 mg | ORAL_CAPSULE | Freq: Three times a day (TID) | ORAL | Status: DC
  Administered 2022-08-29 – 2022-08-30 (×4): 300 mg via ORAL
  Filled 2022-08-29 (×4): qty 1

## 2022-08-29 MED ORDER — CEFAZOLIN SODIUM-DEXTROSE 2-4 GM/100ML-% IV SOLN
2.0000 g | INTRAVENOUS | Status: AC
  Filled 2022-08-29: qty 100

## 2022-08-29 MED ORDER — FENTANYL CITRATE (PF) 100 MCG/2ML IJ SOLN
INTRAMUSCULAR | Status: DC | PRN
  Administered 2022-08-29 (×2): 25 ug via INTRAVENOUS
  Administered 2022-08-29: 50 ug via INTRAVENOUS
  Administered 2022-08-29 (×2): 25 ug via INTRAVENOUS

## 2022-08-29 MED ORDER — SODIUM CHLORIDE 0.9 % IV SOLN: INTRAVENOUS | Status: DC

## 2022-08-29 MED ORDER — INSULIN ASPART 100 UNIT/ML IJ SOLN
0.0000 [IU] | Freq: Three times a day (TID) | INTRAMUSCULAR | Status: DC
  Administered 2022-08-29: 3 [IU] via SUBCUTANEOUS
  Administered 2022-08-30: 2 [IU] via SUBCUTANEOUS
  Administered 2022-08-30: 3 [IU] via SUBCUTANEOUS
  Administered 2022-08-30: 2 [IU] via SUBCUTANEOUS
  Filled 2022-08-29 (×4): qty 1

## 2022-08-29 MED ORDER — TIROFIBAN HCL IN NACL 5-0.9 MG/100ML-% IV SOLN
0.1500 ug/kg/min | INTRAVENOUS | Status: AC
  Administered 2022-08-29 – 2022-08-30 (×3): 0.15 ug/kg/min via INTRAVENOUS
  Filled 2022-08-29 (×6): qty 100

## 2022-08-29 MED ORDER — TIROFIBAN (AGGRASTAT) BOLUS VIA INFUSION: 25.0000 ug/kg | Freq: Once | INTRAVENOUS | Status: AC

## 2022-08-29 MED ORDER — MIDAZOLAM HCL 2 MG/2ML IJ SOLN
INTRAMUSCULAR | Status: DC | PRN
  Administered 2022-08-29: .5 mg via INTRAVENOUS
  Administered 2022-08-29: 2 mg via INTRAVENOUS
  Administered 2022-08-29 (×3): 1 mg via INTRAVENOUS

## 2022-08-29 MED ORDER — TIROFIBAN HCL IV 12.5 MG/250 ML
INTRAVENOUS | Status: AC
  Administered 2022-08-29: 2495 ug via INTRAVENOUS
  Filled 2022-08-29: qty 250

## 2022-08-29 MED ORDER — IODIXANOL 320 MG/ML IV SOLN
INTRAVENOUS | Status: DC | PRN
  Administered 2022-08-29: 50 mL

## 2022-08-29 MED ORDER — FAMOTIDINE 20 MG PO TABS: 40.0000 mg | ORAL_TABLET | Freq: Once | ORAL | Status: DC | PRN

## 2022-08-29 MED ORDER — HYDROMORPHONE HCL 1 MG/ML IJ SOLN
1.0000 mg | Freq: Once | INTRAMUSCULAR | Status: AC | PRN
  Administered 2022-08-29: 1 mg via INTRAVENOUS

## 2022-08-29 MED ORDER — ASPIRIN 81 MG PO TBEC
81.0000 mg | DELAYED_RELEASE_TABLET | Freq: Every day | ORAL | Status: DC
  Administered 2022-08-29 – 2022-08-30 (×2): 81 mg via ORAL
  Filled 2022-08-29 (×2): qty 1

## 2022-08-29 MED ORDER — HYDROMORPHONE HCL 1 MG/ML IJ SOLN
INTRAMUSCULAR | Status: AC
  Filled 2022-08-29: qty 1

## 2022-08-29 MED ORDER — MIDAZOLAM HCL 2 MG/2ML IJ SOLN
INTRAMUSCULAR | Status: AC
  Filled 2022-08-29: qty 2

## 2022-08-29 MED ORDER — HYDROCODONE-ACETAMINOPHEN 5-325 MG PO TABS
1.0000 | ORAL_TABLET | Freq: Four times a day (QID) | ORAL | Status: DC | PRN
  Administered 2022-08-29 – 2022-08-30 (×3): 1 via ORAL
  Filled 2022-08-29 (×3): qty 1

## 2022-08-29 MED ORDER — INSULIN ASPART 100 UNIT/ML IJ SOLN: 0.0000 [IU] | Freq: Every day | INTRAMUSCULAR | Status: DC

## 2022-08-29 MED ORDER — FENTANYL CITRATE PF 50 MCG/ML IJ SOSY
PREFILLED_SYRINGE | INTRAMUSCULAR | Status: AC
  Filled 2022-08-29: qty 1

## 2022-08-29 MED ORDER — FINASTERIDE 5 MG PO TABS
5.0000 mg | ORAL_TABLET | Freq: Every day | ORAL | Status: DC
  Administered 2022-08-29 – 2022-08-30 (×2): 5 mg via ORAL
  Filled 2022-08-29 (×2): qty 1

## 2022-08-29 MED ORDER — MIDAZOLAM HCL 2 MG/2ML IJ SOLN
INTRAMUSCULAR | Status: AC
  Filled 2022-08-29: qty 4

## 2022-08-29 MED ORDER — NITROGLYCERIN 1 MG/10 ML FOR IR/CATH LAB
INTRA_ARTERIAL | Status: DC | PRN
  Administered 2022-08-29: 400 ug via INTRA_ARTERIAL
  Administered 2022-08-29: 300 ug via INTRA_ARTERIAL

## 2022-08-29 MED ORDER — FENTANYL CITRATE (PF) 100 MCG/2ML IJ SOLN
INTRAMUSCULAR | Status: AC
  Filled 2022-08-29: qty 2

## 2022-08-29 MED ORDER — DIPHENHYDRAMINE HCL 50 MG/ML IJ SOLN: 50.0000 mg | Freq: Once | INTRAMUSCULAR | Status: DC | PRN

## 2022-08-29 MED ORDER — TAMSULOSIN HCL 0.4 MG PO CAPS
0.4000 mg | ORAL_CAPSULE | Freq: Every day | ORAL | Status: DC
  Administered 2022-08-29 – 2022-08-30 (×2): 0.4 mg via ORAL
  Filled 2022-08-29 (×2): qty 1

## 2022-08-29 MED ORDER — ATORVASTATIN CALCIUM 20 MG PO TABS
40.0000 mg | ORAL_TABLET | Freq: Every evening | ORAL | Status: DC
  Administered 2022-08-29 – 2022-08-30 (×2): 40 mg via ORAL
  Filled 2022-08-29 (×2): qty 2

## 2022-08-29 MED ORDER — METHYLPREDNISOLONE SODIUM SUCC 125 MG IJ SOLR: 125.0000 mg | Freq: Once | INTRAMUSCULAR | Status: DC | PRN

## 2022-08-29 MED ORDER — RIVAROXABAN 20 MG PO TABS
20.0000 mg | ORAL_TABLET | Freq: Every day | ORAL | Status: DC
  Administered 2022-08-30: 20 mg via ORAL
  Filled 2022-08-29: qty 1

## 2022-08-29 MED ORDER — HEPARIN SODIUM (PORCINE) 1000 UNIT/ML IJ SOLN
INTRAMUSCULAR | Status: DC | PRN
  Administered 2022-08-29: 3000 [IU] via INTRAVENOUS

## 2022-08-29 MED ORDER — CEFAZOLIN SODIUM-DEXTROSE 2-4 GM/100ML-% IV SOLN
INTRAVENOUS | Status: AC
  Administered 2022-08-29: 2 g via INTRAVENOUS
  Filled 2022-08-29: qty 100

## 2022-08-29 SURGICAL SUPPLY — 18 items
BALLN LUTONIX 018 4X80X130 (BALLOONS) ×1
BALLN LUTONIX 7X100X130 (BALLOONS) ×1
BALLN ULTRVRSE 2.5X300X150 (BALLOONS) ×1
BALLN ULTRVRSE 2X100X150 (BALLOONS) ×1
BALLOON LUTONIX 018 4X80X130 (BALLOONS) IMPLANT
BALLOON LUTONIX 7X100X130 (BALLOONS) IMPLANT
BALLOON ULTRVRSE 2.5X300X150 (BALLOONS) IMPLANT
BALLOON ULTRVRSE 2X100X150 (BALLOONS) IMPLANT
DEVICE STARCLOSE SE CLOSURE (Vascular Products) IMPLANT
DRAPE C-ARM 35X43 STRL (DRAPES) IMPLANT
KIT ENCORE 26 ADVANTAGE (KITS) IMPLANT
PACK ANGIOGRAPHY (CUSTOM PROCEDURE TRAY) ×1 IMPLANT
STENT VIABAHN 8X150X120 (Permanent Stent) ×1 IMPLANT
STENT VIABAHN 8X15X120 7FR (Permanent Stent) IMPLANT
STENT VIABAHN 8X50X120 (Permanent Stent) IMPLANT
STENT VIABAHN5X120X8X (Permanent Stent) ×1 IMPLANT
WIRE G V18X300CM (WIRE) IMPLANT
WIRE GUIDERIGHT .035X150 (WIRE) IMPLANT

## 2022-08-29 NOTE — Interval H&P Note (Signed)
History and Physical Interval Note:  08/29/2022 9:26 AM  Vincent [name]  has presented today for surgery, with the diagnosis of Leg ischemia.  The various methods of treatment have been discussed with the patient and family. After consideration of risks, benefits and other options for treatment, the patient has consented to  Procedure(s): Lower Extremity Angiography (Left) as a surgical intervention.  The patient's history has been reviewed, patient examined, no change in status, stable for surgery.  I have reviewed the patient's chart and labs.  Questions were answered to the patient's satisfaction.     Festus Barren

## 2022-08-29 NOTE — Op Note (Signed)
[place] [facility]  Percutaneous Study/Intervention Procedural Note   Date of Surgery: 08/29/2022  Surgeon(s):Hajira Verhagen    Assistants:none  Pre-operative Diagnosis: PAD with rest [name] left lower extremity status overnight thrombolytic therapy  Post-operative diagnosis:  Same  Procedure(s) Performed:             1.  Selective left lower extremity angiogram             2.  Stent placement x2 to the left SFA and most proximal popliteal artery with 8 mm diameter by 15 cm length and 8 mm diameter by 5 cm length Viabahn stents             3.  Percutaneous transluminal angioplasty of the left peroneal artery with 2 and 2-1/2 mm diameter angioplasty balloons             4.  Percutaneous transluminal angioplasty of left tibioperoneal trunk with 2-1/2 mm diameter conventional and 4 mm diameter Lutonix drug-coated angioplasty balloon             5.  StarClose closure device right femoral artery  EBL: 5 cc disease  Contrast: 50 cc  Fluoro Time: 7.6 minutes  Moderate Conscious Sedation Time: approximately 52 minutes using 5.5 mg of Versed and 150 mcg of Fentanyl              Indications:  Patient is a 61 y.o.male with recurrent rest [name] in the left lower extremity. The patient has been getting overnight thrombolytic therapy for thrombus in the left SFA and popliteal arteries with poor runoff distally.  The patient is brought in for angiography for further evaluation and potential treatment.  Due to the limb threatening nature of the situation, angiogram was performed for attempted limb salvage. The patient is aware that if the procedure fails, amputation would be expected.  The patient also understands that even with successful revascularization, amputation may still be required due to the severity of the situation.  Risks and benefits are discussed and informed consent is obtained.   Procedure:  The patient was identified and appropriate procedural time out was performed.  The  patient was then placed supine on the table and prepped and draped in the usual sterile fashion. Moderate conscious sedation was administered during a face to face encounter with the patient throughout the procedure with my supervision of the RN administering medicines and monitoring the patient's vital signs, pulse oximetry, telemetry and mental status throughout from the start of the procedure until the patient was taken to the recovery room.  The existing thrombolytic catheter was removed after exchanging for a V18 wire which was parked in the peroneal artery distally.  Selective left lower extremity angiogram was then performed. This demonstrated 80% stenosis of the mid left SFA thrombus and stenosis at the distal SFA approaching 90% narrowing.  The popliteal artery fairly normal down to the tibial vessels which were heavily diseased.  The tibioperoneal trunk appeared to have high-grade stenosis 90% or more the peroneal artery was the only runoff multiple areas of greater than 60% stenosis throughout.  The anterior tibial and posterior tibial arteries appeared chronically occluded. It was felt that it was in the patient's best interest to proceed with intervention after these images to avoid a second procedure and a larger amount of contrast and fluoroscopy based off of the findings from the initial angiogram. The patient was systemically heparinized. I then selected an 8 mm diameter by 15 cm length Viabahn stent to encompass  both lesions in the SFA but there was need to extend distally and additional 8 mm diameter by 5 cm length Viabahn stent was also placed.  These were postdilated with 7 mm diameter balloons with excellent angiographic completion result and less than 10% residual stenosis.  I then turned my attention to the tibial disease.  The peroneal artery was treated with 10 mm diameter by 10 cm length angioplasty balloon in the distal peroneal artery inflated to 6 atm for 1 minute.  A 2 and half  millimeter diameter by 30 cm length angioplasty balloon was distal.  Artery up tibioperoneal trunk and taken to 10 atm for 1 minute.  Completion imaging showed markedly improved flow in the peroneal artery with less than 30% residual stenosis until the most distal peroneal artery with.  To be significant spasm present.  The tibioperoneal trunk still had greater than 50% stenosis so I used a 4 mm diameter by 8 cm length Onyx drug-coated angioplasty balloon tibioperoneal trunk and inflated this gently to 4 atm.  Following this there was about a 25 to 30% residual stenosis in the tibioperoneal trunk which was nonflow limiting.  I elected to terminate the procedure. The sheath was removed and StarClose closure device was deployed in the right femoral artery with excellent hemostatic result. The patient was taken to the recovery room in stable condition having tolerated the procedure well.  Findings:                          Left Lower Extremity:  This demonstrated 80% stenosis of the mid left SFA thrombus and stenosis at the distal SFA approaching 90% narrowing.  The popliteal artery fairly normal down to the tibial vessels which were heavily diseased.  The tibioperoneal trunk appeared to have high-grade stenosis 90% or more the peroneal artery was the only runoff multiple areas of greater than 60% stenosis throughout.  The anterior tibial and posterior tibial arteries appeared chronically occluded.   Disposition: Patient was taken to the recovery room in stable condition having tolerated the procedure well.  Complications: None  Vincent [name] 08/29/2022 9:29 AM   This note was created with Dragon Medical transcription system. Any errors in dictation are purely unintentional.

## 2022-08-29 NOTE — Consult Note (Signed)
ANTICOAGULATION CONSULT NOTE  Pharmacy Consult for heparin infusion Indication: DVT  No Known Allergies  Patient Measurements: Height: 6\' 6"  (198.1 cm) Weight: 99.8 kg (220 lb 0.3 oz) IBW/kg (Calculated) : 91.4 Heparin Dosing Weight: 99.8kg  Vital Signs: Temp: 98.6 F (37 C) (11/09 0400) Temp Source: Oral (11/09 0400) BP: 139/78 (11/09 0600) Pulse Rate: 79 (11/09 0600)  Labs: Recent Labs    08/27/22 1825 08/27/22 1904 08/28/22 0138 08/28/22 0517 08/28/22 1013 08/28/22 1441 08/28/22 1550 08/28/22 2007 08/29/22 0405  HGB 14.9  --   --  14.0  --  14.0  --  14.4 13.7  HCT 42.5  --   --  39.4  --  40.7  --  42.5 40.3  PLT 170  --   --  173  --  159  --  141* 115*  APTT  --  28  --   --   --   --   --   --   --   LABPROT 14.0  --   --   --   --   --   --   --   --   INR 1.1  --   --   --   --   --   --   --   --   HEPARINUNFRC  --   --    < >  --    < >  --  0.34 <0.10* <0.10*  CREATININE 1.04  --   --  0.95  --   --   --   --  1.01   < > = values in this interval not displayed.     Estimated Creatinine Clearance: 100.6 mL/min (by C-G formula based on SCr of 1.01 mg/dL).   Medical History: Past Medical History:  Diagnosis Date   Chronic kidney disease 07/27/2015   Noted at 07/27/15 [facility] visit as Stage 2   Gout    Hepatitis 11/28/2015   positive for Hep C virus antibody    Medications:  PTA: N/A Inpatient: Heparin infusion (11/7 >>>) Allergies: NKDA  Assessment: 61 year old male with PMH atrial fibrillation, tobacco use, alcohol use, and CKD presents to ED with c/o right leg pain starting 3 days ago.67 Upon physical examination, left foot cold to the touch and unable to find pedal pulse. Right foot warm to touch and weak pedal pulse palpated. Venous doppler study suspicious for occlusive thrombus within the left popliteal artery.  Goal of Therapy:  Heparin level 0.2-0.5 units/ml Monitor platelets by anticoagulation protocol:  Yes  Date Time aPTT/HL Rate/Comment  11/8 0138 1.09                1700 units/hr >> SUPRAtherapeutic 11/8 1013 0.43                Therapeutic x 1 @ 1300 units/hr 11/8 2007 <0.10  Subtherapeutic 11/9     0405    <0.10              SUBtherapeutic  Plan:  Per Dr. 13/9, continue heparin infusion via intra-arterial line post-aortogram at 400 units/hr without adjustment Will recheck HL every 6 hours x 24 hours per vascular team lab orders  Eviana Sibilia D Clinical Pharmacist 08/29/2022 6:14 AM

## 2022-08-29 NOTE — Progress Notes (Signed)
PAD removed at 2030. Pressure applied. No bleeding or bruising at the site. Gauze and tegaderm dressing applied.

## 2022-08-29 NOTE — Progress Notes (Signed)
Patient transported to special procedures with primary RN.

## 2022-08-29 NOTE — Progress Notes (Addendum)
1015 Received patient from Specials recovery. Patient sleepy. Right groin PAD in place. Pulses pedal and posterior tibial doppler bilaterally.1400 Patient continues to refuse to order food from his diet.sipped a little water.1600 Patient complaining of right groin pain. When PAD deflated and re inflated on 30 ml of air reinserted into PAD due to pain. Patient stated that felt better. 1800 Menu read to patient. Dinner and breakfast ordered for patient. Alert and oriented. Given apple juice.

## 2022-08-29 NOTE — Progress Notes (Signed)
PROGRESS NOTE    Vincent [name]  [id number] DOB: 1961-03-11 DOA: 08/27/2022 PCP: Pcp, No    Brief Narrative:  61 y.o. African-American male with medical history significant for stage II chronic kidney disease, Type II diabetes mellitus, peripheral neuropathy, dyslipidemia, BPH, gout and hepatitis C as well as PAD s/p recent thrombectomy on the right lower extremity in June 2023,, who presented to the emergency room with acute onset of worsening left foot pain over the last 3 days that he graded 9/10 in severity and described as aching deep pain radiating up the left lower extremity.  He stated that it was similar to the occlusion he had earlier this year in the right leg.  No headache or dizziness or blurred vision.  No nausea or vomiting or abdominal pain.  No dyspnea or cough or wheezing.  No chest pain or palpitations.  No dysuria, oliguria or hematuria or flank pain.  No other bleeding diathesis.  The patient admitted that he missed 3 days of Xarelto over the last week as he could not afford it.   Vascular consulted.  N.p.o. for angiography on 11/8.  Patient remains on heparin gtt.  11/9: Status post vascular procedure x2.  Lower extremity successfully revascularized.  Pedal and posterior tibial pulses identifiable by Doppler  Assessment & Plan:   Principal Problem:   Critical limb ischemia of left lower extremity (HCC) Active Problems:   Type 2 diabetes mellitus with peripheral neuropathy (HCC)   Tobacco abuse   Hyponatremia   Dyslipidemia   Peripheral arterial disease (HCC)   BPH (benign prostatic hyperplasia)   Gout   Femoral artery occlusion, left (HCC)  * Critical limb ischemia of left lower extremity (HCC) Suspected etiology is nonadherence to prescribed anticoagulant.  Patient states he was on Xarelto but stopped taking because he could not afford.  Imaging on arrival concerning for abrupt occlusion of popliteal artery on left lower extremity. Status post angiography  with successful revascularization Plan: Heparin drip stopped Xarelto restarted Continue aspirin Aggrastat infusion running TOC consult for medication assistance as patient states he was unable to afford Xarelto ICU status for now.  Defer to vascular surgery regarding transfer to floor  Type 2 diabetes mellitus with peripheral neuropathy ([place]) Hold home metformin SSI Accu-Cheks before meals and at bedtime Continue Neurontin and Cymbalta Carb modified diet  Tobacco abuse Counseling Offered nicotine patch   Peripheral arterial disease (HCC) PTA aspirin and statin We will reach out to [place] for medication management assistance   Dyslipidemia PTA statin   Hyponatremia Resolved   Gout PTA allopurinol   BPH (benign prostatic hyperplasia) PTA Flomax and Proscar  DVT prophylaxis: Xarelto Code Status: Full code Family Communication: None today.  Offered to call, patient declined Disposition Plan: Status is: Inpatient Remains inpatient appropriate because: Critical limb ischemia status post revascularization   Level of care: ICU  Consultants:  Vascular surgery  Procedures:  Left lower extremity angiogram 11/8  Antimicrobials: None   Subjective: Seen and examined post revascularization.  Pulses identifiable by Doppler.  No visible distress.  Objective: Vitals:   08/29/22 1000 08/29/22 1015 08/29/22 1200 08/29/22 1300  BP: (!) 144/83 137/78 130/80 (!) 141/86  Pulse: 71 68 73 73  Resp: 16 14 12 19   Temp:  98.3 F (36.8 C)    TempSrc:  Oral    SpO2: 94% 95% 94% 97%  Weight:      Height:        Intake/Output Summary (Last 24 hours) at 08/29/2022  [place] filed at 08/29/2022 1015 Gross per 24 hour  Intake 816.43 ml  Output 950 ml  Net -133.57 ml   Filed Weights   08/27/22 1626 08/28/22 1124  Weight: 99.8 kg 99.8 kg    Examination:  General exam: No acute distress Respiratory system: Lungs clear.  Normal breathing.  Room air Cardiovascular  system: S1-S2, RRR, no murmurs, no pedal edema Gastrointestinal system: Soft, NT/ND, normal bowel sounds Central nervous system: Alert and oriented. No focal neurological deficits. Extremities: PT and DP pulses identifiable by Doppler Skin: No rashes, lesions or ulcers Psychiatry: Judgement and insight appear normal. Mood & affect appropriate.     Data Reviewed: I have personally reviewed following labs and imaging studies  CBC: Recent Labs  Lab 08/27/22 1825 08/28/22 0517 08/28/22 1441 08/28/22 2007 08/29/22 0405  WBC 6.6 5.1 6.8 6.3 5.4  NEUTROABS 1.7  --   --   --   --   HGB 14.9 14.0 14.0 14.4 13.7  HCT 42.5 39.4 40.7 42.5 40.3  MCV 86.6 87.2 88.3 88.5 89.4  PLT 170 173 159 141* [id number]*   Basic Metabolic Panel: Recent Labs  Lab 08/27/22 1825 08/28/22 0517 08/29/22 0405  NA 132* 138 138  K 3.7 3.6 4.0  CL 100 109 107  CO2 20* 22 25  GLUCOSE 165* 135* 153*  BUN 6 6 7   CREATININE 1.04 0.95 1.01  CALCIUM 8.3* 7.6* 8.0*   GFR: Estimated Creatinine Clearance: 100.6 mL/min (by C-G formula based on SCr of 1.01 mg/dL). Liver Function Tests: Recent Labs  Lab 08/27/22 1825  AST 35  ALT 26  ALKPHOS 87  BILITOT 0.9  PROT 7.3  ALBUMIN 3.6   No results for input(s): "LIPASE", "AMYLASE" in the last 168 hours. No results for input(s): "AMMONIA" in the last 168 hours. Coagulation Profile: Recent Labs  Lab 08/27/22 1825  INR 1.1   Cardiac Enzymes: No results for input(s): "CKTOTAL", "CKMB", "CKMBINDEX", "TROPONINI" in the last 168 hours. BNP (last 3 results) No results for input(s): "PROBNP" in the last 8760 hours. HbA1C: No results for input(s): "HGBA1C" in the last 72 hours. CBG: Recent Labs  Lab 08/28/22 1432 08/28/22 1848 08/29/22 0341 08/29/22 1001 08/29/22 1236  GLUCAP 159* 141* 151* 154* 145*   Lipid Profile: No results for input(s): "CHOL", "HDL", "LDLCALC", "TRIG", "CHOLHDL", "LDLDIRECT" in the last 72 hours. Thyroid Function Tests: No results  for input(s): "TSH", "T4TOTAL", "FREET4", "T3FREE", "THYROIDAB" in the last 72 hours. Anemia Panel: No results for input(s): "VITAMINB12", "FOLATE", "FERRITIN", "TIBC", "IRON", "RETICCTPCT" in the last 72 hours. Sepsis Labs: No results for input(s): "PROCALCITON", "LATICACIDVEN" in the last 168 hours.  Recent Results (from the past 240 hour(s))  MRSA Next Gen by PCR, Nasal     Status: None   Collection Time: 08/28/22  6:45 PM   Specimen: Nasal Mucosa; Nasal Swab  Result Value Ref Range Status   MRSA by PCR Next Gen NOT DETECTED NOT DETECTED Final    Comment: (NOTE) The GeneXpert MRSA Assay (FDA approved for NASAL specimens only), is one component of a comprehensive MRSA colonization surveillance program. It is not intended to diagnose MRSA infection nor to guide or monitor treatment for MRSA infections. Test performance is not FDA approved in patients less than 58 years old. Performed at [facility], [address], [place], [place] [address]          Radiology Studies: PERIPHERAL VASCULAR CATHETERIZATION  Result Date: 08/29/2022 See surgical note for result.  PERIPHERAL VASCULAR CATHETERIZATION  Result Date: 08/28/2022 See surgical note for result.  CT ANGIO AO+BIFEM W & OR WO CONTRAST  Result Date: 08/27/2022 CLINICAL DATA:  Leg ischemia. EXAM: CT ANGIOGRAPHY OF ABDOMINAL AORTA WITH ILIOFEMORAL RUNOFF TECHNIQUE: Multidetector CT imaging of the abdomen, pelvis and lower extremities was performed using the standard protocol during bolus administration of intravenous contrast. Multiplanar CT image reconstructions and MIPs were obtained to evaluate the vascular anatomy. RADIATION DOSE REDUCTION: This exam was performed according to the departmental dose-optimization program which includes automated exposure control, adjustment of the mA and/or kV according to patient size and/or use of iterative reconstruction technique. CONTRAST:  OMNIPAQUE IOHEXOL 350 MG/ML SOLN  COMPARISON:  None Available. FINDINGS: VASCULAR Aorta: Normal caliber thoracic aorta with moderate to severe atherosclerotic disease disease. Celiac: Patent without evidence of aneurysm, dissection, vasculitis or significant stenosis. SMA: Patent without evidence of aneurysm, dissection, vasculitis or significant stenosis. Renals: Both renal arteries are patent without evidence of aneurysm, dissection, vasculitis, fibromuscular dysplasia or significant stenosis. IMA: Patent without evidence of aneurysm, dissection, vasculitis or significant stenosis. RIGHT Lower Extremity Inflow: Common and external iliac arteries are patent without evidence of aneurysm, dissection, vasculitis or significant stenosis. Mild-to-moderate multifocal narrowing of the proximal internal iliac artery and complete occlusion of the distal internal right iliac artery. Outflow: Patent common femoral to popliteal artery bypass graft. Occluded native superficial femoral artery. Profunda femoral artery and popliteal artery are patent without evidence of aneurysm, dissection, vasculitis or significant stenosis. Runoff: Two-vessel runoff to the ankle via the posterior tibia and peroneal arteries. Multifocal intermittent occlusion of the anterior tibial artery. LEFT Lower Extremity Inflow: Common and external iliac arteries are patent without evidence of aneurysm, dissection, vasculitis or significant stenosis. Complete occlusion of the left internal iliac artery. Outflow: Common, profunda, and superficial femoral arteries are patent without evidence of aneurysm, dissection, vasculitis or significant stenosis. Severe narrowing of the mid superficial femoral artery on series 4, image 235 and abrupt occlusion of the distal superficial femoral artery seen on series 4, image 60. Non-opacification of the popliteal artery. Runoff: Non opacification of the calf vessels. Veins: No obvious venous abnormality within the limitations of this arterial phase  study. Review of the MIP images confirms the above findings. NON-VASCULAR Lower chest: Small hiatal hernia.  No acute abnormality. Hepatobiliary: No focal liver abnormality is seen. No gallstones, gallbladder wall thickening, or biliary dilatation. Pancreas: Unremarkable. No pancreatic ductal dilatation or surrounding inflammatory changes. Spleen: Normal in size without focal abnormality. Adrenals/Urinary Tract: Adrenal glands are unremarkable. Kidneys are normal, without renal calculi, focal lesion, or hydronephrosis. Bladder is unremarkable. Stomach/Bowel: Stomach is within normal limits. Appendix appears normal. No evidence of bowel wall thickening, distention, or inflammatory changes. Lymphatic: No significant vascular findings are present. No enlarged abdominal or pelvic lymph nodes. Reproductive: Prostate is unremarkable. Other: No abdominal wall hernia or abnormality. No abdominopelvic ascites. Musculoskeletal: No acute or significant osseous findings. IMPRESSION: VASCULAR 1. Abrupt occlusion of the distal left superficial femoral artery with non opacification of the left popliteal artery and runoff vessels, concerning for thromboembolic disease. 2. Patent right common femoral to popliteal bypass graft with two runoff to the right ankle. NON-VASCULAR 1. No acute abnormality in the abdomen or pelvis. Critical Value/emergent results were called by telephone at the time of interpretation on 08/27/2022 at 8:08 pm to provider [facility] , who verbally acknowledged these results. Electronically Signed   By: Allegra Lai M.D.   On: 08/27/2022 20:24   US Venous Img  Lower  Left (DVT Study)  Result Date: 08/27/2022 CLINICAL DATA:  Left leg pain EXAM: Left LOWER EXTREMITY VENOUS DOPPLER ULTRASOUND TECHNIQUE: Gray-scale sonography with compression, as well as color and duplex ultrasound, were performed to evaluate the deep venous system(s) from the level of the common femoral vein through the popliteal and  proximal calf veins. COMPARISON:  03/09/2021 FINDINGS: VENOUS Normal compressibility of the common femoral, superficial femoral, and popliteal veins, as well as the visualized calf veins. Visualized portions of profunda femoral vein and great saphenous vein unremarkable. No filling defects to suggest DVT on grayscale or color Doppler imaging. Doppler waveforms show normal direction of venous flow, normal respiratory plasticity and response to augmentation. Limited views of the contralateral common femoral vein are unremarkable. OTHER Suspected thrombus within the popliteal artery. Limitations: none IMPRESSION: Negative for acute left lower extremity DVT. Suspected occlusive thrombus within the left popliteal artery. Suggest further evaluation with angiography. Critical Value/emergent results were called by telephone at the time of interpretation on 08/27/2022 at 6:10 pm to provider PA Manuela Schwartz for Mardee Postin , who verbally acknowledged these results. Electronically Signed   By: Donavan Foil M.D.   On: 08/27/2022 18:11        Scheduled Meds:  aspirin EC  81 mg Oral Daily   atorvastatin  40 mg Oral QPM   Chlorhexidine Gluconate Cloth  6 each Topical Daily   DULoxetine  30 mg Oral Daily   fentaNYL       fentaNYL       finasteride  5 mg Oral Daily   gabapentin  300 mg Oral TID   heparin sodium (porcine)       HYDROmorphone       insulin aspart  0-15 Units Subcutaneous TID WC   insulin aspart  0-5 Units Subcutaneous QHS   midazolam       midazolam       nitroGLYCERIN       [START ON 08/30/2022] rivaroxaban  20 mg Oral Q supper   sodium chloride flush  3 mL Intravenous Q12H   tamsulosin  0.4 mg Oral QPC supper   Continuous Infusions:  sodium chloride     tirofiban 0.15 mcg/kg/min (08/29/22 0958)     LOS: 2 days      Sidney Ace, MD [facility]   If 7PM-7AM, please contact night-coverage  08/29/2022, 2:21 PM

## 2022-08-30 ENCOUNTER — Other Ambulatory Visit: Payer: Self-pay

## 2022-08-30 DIAGNOSIS — Z9889 Other specified postprocedural states: Secondary | ICD-10-CM

## 2022-08-30 DIAGNOSIS — Z7901 Long term (current) use of anticoagulants: Secondary | ICD-10-CM

## 2022-08-30 DIAGNOSIS — I743 Embolism and thrombosis of arteries of the lower extremities: Secondary | ICD-10-CM

## 2022-08-30 DIAGNOSIS — I70202 Unspecified atherosclerosis of native arteries of extremities, left leg: Secondary | ICD-10-CM

## 2022-08-30 DIAGNOSIS — I70222 Atherosclerosis of native arteries of extremities with rest pain, left leg: Secondary | ICD-10-CM

## 2022-08-30 DIAGNOSIS — F17210 Nicotine dependence, cigarettes, uncomplicated: Secondary | ICD-10-CM

## 2022-08-30 DIAGNOSIS — Z95828 Presence of other vascular implants and grafts: Secondary | ICD-10-CM

## 2022-08-30 LAB — CBC
HCT: 38.2 % — ABNORMAL LOW (ref 39.0–52.0)
Hemoglobin: 13.2 g/dL (ref 13.0–17.0)
MCH: 30.6 pg (ref 26.0–34.0)
MCHC: 34.6 g/dL (ref 30.0–36.0)
MCV: 88.6 fL (ref 80.0–100.0)
Platelets: 111 10*3/uL — ABNORMAL LOW (ref 150–400)
RBC: 4.31 MIL/uL (ref 4.22–5.81)
RDW: 14.5 % (ref 11.5–15.5)
WBC: 6 10*3/uL (ref 4.0–10.5)
nRBC: 0 % (ref 0.0–0.2)

## 2022-08-30 LAB — BASIC METABOLIC PANEL
Anion gap: 7 (ref 5–15)
BUN: 8 mg/dL (ref 6–20)
CO2: 25 mmol/L (ref 22–32)
Calcium: 7.9 mg/dL — ABNORMAL LOW (ref 8.9–10.3)
Chloride: 103 mmol/L (ref 98–111)
Creatinine, Ser: 0.95 mg/dL (ref 0.61–1.24)
GFR, Estimated: >60 mL/min (ref >60)
Glucose, Bld: 148 mg/dL — ABNORMAL HIGH (ref 70–99)
Potassium: 3.9 mmol/L (ref 3.5–5.1)
Sodium: 135 mmol/L (ref 135–145)

## 2022-08-30 LAB — GLUCOSE, CAPILLARY
Glucose-Capillary: 136 mg/dL — ABNORMAL HIGH (ref 70–99)
Glucose-Capillary: 133 mg/dL — ABNORMAL HIGH (ref 70–99)
Glucose-Capillary: 154 mg/dL — ABNORMAL HIGH (ref 70–99)

## 2022-08-30 MED ORDER — HYDROCODONE-ACETAMINOPHEN 5-325 MG PO TABS: 1.0000 | ORAL_TABLET | Freq: Four times a day (QID) | ORAL | 0 refills | Status: AC | PRN

## 2022-08-30 MED ORDER — APIXABAN 5 MG PO TABS
5.0000 mg | ORAL_TABLET | Freq: Two times a day (BID) | ORAL | 5 refills | Status: DC
  Filled 2022-08-30: qty 60, 30d supply, fill #0

## 2022-08-30 MED ORDER — MORPHINE SULFATE (PF) 2 MG/ML IV SOLN
1.0000 mg | INTRAVENOUS | Status: DC | PRN
  Administered 2022-08-30: 4 mg via INTRAVENOUS
  Administered 2022-08-30: 2 mg via INTRAVENOUS
  Administered 2022-08-30: 4 mg via INTRAVENOUS
  Filled 2022-08-30: qty 2
  Filled 2022-08-30: qty 1
  Filled 2022-08-30: qty 2

## 2022-08-30 MED ORDER — ATORVASTATIN CALCIUM 40 MG PO TABS: 40.0000 mg | ORAL_TABLET | Freq: Every day | ORAL | 11 refills | Status: DC

## 2022-08-30 NOTE — TOC Initial Note (Signed)
Transition of Care [facility]) - Initial/Assessment Note    Patient Details  Name: Vincent [name] MRN: [id number] Date of Birth: October 07, 1961  [facility]) CM/SW Contact:    Shelbie Hutching, RN Phone Number: 08/30/2022, 2:18 PM  Clinical Narrative:                 Patient admitted to the hospital with critical limb ischemia s/p bilateral lower extremity angio with stent placement.  Patient is medically cleared for discharge home today, he will be started on Eliquis.  Patient does not have any insurance so prescription sent over to [facility]  Patient does not currently have a PCP, provided him with [facility].  Application for [facility] given to patient as well.  Patient will follow up with Vasular surgery outpatient.    Patient reports that he is not currently driving and he may not be able to get a ride home.  If patient cannot find a ride RNCM will arrange Safe Transport.  RNCM will also pick up Eliquis and deliver to patient's bedside.    Expected Discharge Plan: Home/Self Care Barriers to Discharge: Continued Medical Work up   Patient Goals and CMS Choice        Expected Discharge Plan and Services Expected Discharge Plan: Home/Self Care   Discharge Planning Services: CM Consult, Medication Assistance, [facility]   Living arrangements for the past 2 months: Apartment Expected Discharge Date: 08/30/22               DME Arranged: N/A DME Agency: NA       HH Arranged: NA [place] Agency: NA        Prior Living Arrangements/Services Living arrangements for the past 2 months: Apartment Lives with:: Self Patient language and need for interpreter reviewed:: Yes Do you feel safe going back to the place where you live?: Yes      Need for Family Participation in Patient Care: Yes (Comment) Care giver support system in place?: Yes (comment)   Criminal Activity/Legal  Involvement Pertinent to Current Situation/Hospitalization: No - Comment as needed  Activities of Daily Living Home Assistive Devices/Equipment: Environmental consultant (specify type), Shower chair with back, Cane (specify quad or straight) ADL Screening (condition at time of admission) Patient's cognitive ability adequate to safely complete daily activities?: Yes Is the patient deaf or have difficulty hearing?: No Does the patient have difficulty seeing, even when wearing glasses/contacts?: No Does the patient have difficulty concentrating, remembering, or making decisions?: No Patient able to express need for assistance with ADLs?: Yes Does the patient have difficulty dressing or bathing?: No Independently performs ADLs?: Yes (appropriate for developmental age) Does the patient have difficulty walking or climbing stairs?: Yes Weakness of Legs: Left Weakness of Arms/Hands: None  Permission Sought/Granted                  Emotional Assessment Appearance:: Appears stated age Attitude/Demeanor/Rapport: Engaged Affect (typically observed): Accepting Orientation: : Oriented to Self, Oriented to Place, Oriented to  Time, Oriented to Situation Alcohol / Substance Use: Not Applicable Psych Involvement: No (comment)  Admission diagnosis:  Femoral artery occlusion, left (HCC) [I70.202] Critical limb ischemia of left lower extremity (HCC) [I70.222] Acute lower limb ischemia [I99.8] Patient Active Problem List   Diagnosis Date Noted   Femoral artery occlusion, left ([place]) 08/28/2022   Critical limb ischemia of left lower extremity ([place]) 08/27/2022   Type 2 diabetes  mellitus with peripheral neuropathy (HCC) 08/27/2022   Dyslipidemia 08/27/2022   BPH (benign prostatic hyperplasia) 08/27/2022   Gout 08/27/2022   Peripheral arterial disease (HCC) 08/27/2022   Chest pain 03/09/2021   Atrial fibrillation (HCC) 03/09/2021   Hyponatremia 03/09/2021   Acute renal failure superimposed on stage 3a chronic  kidney disease (HCC) 03/09/2021   Tobacco abuse 03/09/2021   Alcohol use 03/09/2021   Hypomagnesemia 03/09/2021   Hepatitis C antibody test positive 11/28/2015   Prediabetes 11/28/2015   PCP:  Pcp, No Pharmacy:   [facility] [address] [place] [address] Phone: [phone] Fax: [phone]  [facility] [address] ([address]), [place] - [address] [place]) [place] [address] Phone: [phone] Fax: [phone]     Social Determinants of Health (SDOH) Interventions    Readmission Risk Interventions     No data to display

## 2022-08-30 NOTE — TOC Progression Note (Signed)
Transition of Care [facility]) - Progression Note    Patient Details  Name: Vincent [name] MRN: [id number] Date of Birth: 1960-12-23  Transition of Care [facility]) CM/SW Contact  Allayne Butcher, RN Phone Number: 08/30/2022, 3:27 PM  Clinical Narrative:    Eliquis picked up and delivered to the patient.  Had patient sign the [employer] waiver, he will need transportation home.  Update 3:42- patient will not discharge home today, vascular surgery came to assess, patient in a lot of pain, no pulse in left foot and cold.     Expected Discharge Plan: Home/Self Care Barriers to Discharge: Continued Medical Work up  Expected Discharge Plan and Services Expected Discharge Plan: Home/Self Care   Discharge Planning Services: CM Consult, Medication Assistance, [facility]   Living arrangements for the past 2 months: Apartment Expected Discharge Date: 08/30/22               DME Arranged: N/A DME Agency: NA       HH Arranged: NA HH Agency: NA         Social Determinants of Health (SDOH) Interventions    Readmission Risk Interventions     No data to display

## 2022-08-30 NOTE — Progress Notes (Signed)
[name]  Daily Progress Note   Subjective  -   Vincent [name] is a 61 year old African-American male with stated past medical history of peripheral artery disease He recently underwent  thrombectomy/bypass surgery in June 2023 at [facility]. He presented to the ED with 3 days of worsening left foot pain. On 08/28/2022 the patient underwent an abdominal aortogram, left lower extremity angiography with placement of overnight lysis catheter . He also had a mechanical thrombectomy of the SFA and popliteal on the left. Patient also had placement of a triple-lumen catheter into his RT common femoral vein.  On 08/29/2022 he was taken back to the vascular lab for a left lower extremity angiogram.  He had stent placement x2 to the left SFA and proximal popliteal artery.  He also had angioplasty of the left peroneal artery and left tibioperoneal trunk.  Today patient states that his leg feels much warmer and he denies having any claudication pain symptoms that he once had.  He denies any pain at rest. He states that his left lower extremity still is a little numb which causes him some difficulty walking but otherwise he feels as though he will be able to contribute with daily activities of living. Currently uses a walker to ambulate.   Objective Vitals:   08/30/22 0600 08/30/22 0700 08/30/22 0800 08/30/22 0900  BP: 125/85 126/81 (!) 125/102 139/79  Pulse: 82 78 75 72  Resp: 15 15 18 17   Temp:   98 F (36.7 C)   TempSrc:   Oral   SpO2: 99% 95% 95% 96%  Weight:      Height:        Intake/Output Summary (Last 24 hours) at 08/30/2022 1115 Last data filed at 08/30/2022 0800 Gross per 24 hour  Intake 1320.23 ml  Output 625 ml  Net 695.23 ml    PULM  CTAB CV  RRR VASC  patient has +1 palpable pulses bilaterally in lower his lower extremities today.  Both patient's feet are very warm to touch.  Insertion site in the right groin is soft on palpitation.  No hematoma seroma to note.   He does continue to have a triple-lumen in his right groin common femoral vein as well.  Laboratory CBC    Component Value Date/Time   WBC 6.0 08/30/2022 0429   HGB 13.2 08/30/2022 0429   HGB 16.7 09/24/2014 1034   HCT 38.2 (L) 08/30/2022 0429   HCT 49.4 09/24/2014 1034   PLT 111 (L) 08/30/2022 0429   PLT 198 09/24/2014 1034    BMET    Component Value Date/Time   NA 135 08/30/2022 0429   NA 137 07/27/2015 0000   NA 142 09/24/2014 1034   K 3.9 08/30/2022 0429   K 3.9 09/24/2014 1034   CL 103 08/30/2022 0429   CL 104 09/24/2014 1034   CO2 25 08/30/2022 0429   CO2 28 09/24/2014 1034   GLUCOSE 148 (H) 08/30/2022 0429   GLUCOSE 154 (H) 09/24/2014 1034   BUN 8 08/30/2022 0429   BUN 17 07/27/2015 0000   BUN 12 09/24/2014 1034   CREATININE 0.95 08/30/2022 0429   CREATININE 1.39 (H) 09/24/2014 1034   CALCIUM 7.9 (L) 08/30/2022 0429   CALCIUM 8.5 09/24/2014 1034   GFRNONAA >60 08/30/2022 0429   GFRNONAA 57 (L) 09/24/2014 1034   GFRNONAA 48 (L) 05/06/2013 1328   GFRAA >60 12/07/2018 2209   GFRAA >60 09/24/2014 1034   GFRAA 56 (L) 05/06/2013 1328  Assessment/Planning: POD #2  s/p overnight thrombolytic therapy and selective left lower extremity angiogram with stent placement x2 to the SFA and angioplasty of popliteal arteries on 11/8 and 08/29/2022.  Patient is okay to discharge today status post his procedure.  Patient needs to continue on his Xarelto 20 mg daily and his aspirin 81 mg daily.  Patient may shower on 11/11 after he gets home.  Dressing on right groin can be removed after shower and replaced with Band-Aid x3 days.  Patient not to be lifting anything heavier than a gallon of milk for the next 2 weeks. Patient will need to follow-up 2 to 3 weeks with vein and vascular at [facility].   Marcie Bal  08/30/2022, 11:15 AM

## 2022-08-30 NOTE — Discharge Summary (Signed)
Physician Discharge Summary   Patient: Vincent [name] MRN: [id number] DOB: [birth year]  Admit date:     08/27/2022  Discharge date: 08/30/22  Discharge Physician: Vincent [name]   PCP: Vincent [name]   Recommendations at discharge:    Patient advised to stay in hospital for BKA.  Patient declines BKA & prefers to be discharged home on anticoagulation Medication Change: Xarelto discontinued. (Patient unable to take this medication previously due to cost) New Medication: Eliquis 5 mg po BID, as provided by medication management New medication: Patient given prescription for Percocet 5/325 p.o. every 6 hours as needed Patient will follow-up with vascular surgery in the next 2 weeks  Discharge Diagnoses: Principal Problem:   Critical limb ischemia of left lower extremity (HCC) Active Problems:   Type 2 diabetes mellitus with peripheral neuropathy (HCC)   Tobacco abuse   Hyponatremia   Dyslipidemia   Peripheral arterial disease (HCC)   BPH (benign prostatic hyperplasia)   Gout   Femoral artery occlusion, left (HCC)  Resolved Problems:   * [name] resolved hospital problems. *  Hospital Course: 61 year old male with past medical history of stage II chronic kidney disease, diabetes mellitus type 2, peripheral neuropathy, BPH and hepatitis C as well as PAD status post recent thrombectomy of the right lower extremity in June of this year who presented to the emergency room on 11/7 with acute onset of worsening left foot pain x3 days.  In taking of history, patient was noted to have missed several days of Xarelto from the previous week due to cost.  Vascular surgery was consulted and patient underwent angiogram on 11/9 with stent placement x2 to the left SFA and most proximal popliteal artery as well as percutaneous angioplasty of the left peroneal artery and left tibioperoneal trunk.  Postprocedure, patient was monitored closely in the ICU.  Transitions of care was able to arrange patient to have  Eliquis, which was delivered to his bedside with plans to discharge patient on 11/10.  Prior to discharge, patient started having increased pain and coolness of the left leg again.  Patient seen by vascular surgery immediately and felt that little could be done to save his leg and that he would need a left BKA.  When this was discussed with the patient, he refused and wanted to go home on Eliquis.  Patient given prescription for additional pain medication and will follow-up with vascular surgery in 2 weeks.  Assessment and Plan: Principal Problem:   Critical limb ischemia of left lower extremity (HCC) Active Problems:   Type 2 diabetes mellitus with peripheral neuropathy (HCC)   Tobacco abuse   Hyponatremia   Dyslipidemia   Peripheral arterial disease (HCC)   BPH (benign prostatic hyperplasia)   Gout   Femoral artery occlusion, left (HCC)   * Critical limb ischemia of left lower extremity (HCC) As above.  Status post angioplasty x2 and stent placement x2.  Unfortunately, despite efforts, showing repeat signs of vaso-occlusion and thrombosis.  Recommended to have BKA, but patient declines.  Unable to afford it Xarelto, however able to set up patient with Eliquis to medication management.  Medication for pain control.  Follow-up with vascular surgery in 2 weeks.  Continue aspirin.     Type 2 diabetes mellitus with peripheral neuropathy (HCC) Continue Neurontin.  Covered with sliding scale during hospitalization.  Resume home medications   Tobacco abuse Counseling Offered nicotine patch   Peripheral arterial disease (HCC) As above.   Dyslipidemia Patient statin prescription about to  run out and he was given refill   Hyponatremia Resolved   Gout PTA allopurinol   BPH (benign prostatic hyperplasia) PTA Flomax and Proscar     Pain control - [place] Controlled Substance Reporting System database was reviewed. and patient was instructed, not to drive, operate heavy machinery,  perform activities at heights, swimming or participation in water activities or provide baby-sitting services while on Pain, Sleep and Anxiety Medications; until their outpatient Physician has advised to do so again. Also recommended to not to take more than prescribed Pain, Sleep and Anxiety Medications.  Consultants: Vascular surgery Procedures performed: Status post stent placement x2 and angioplasty x2 of the left lower extremity Disposition: Home Diet recommendation:  Discharge Diet Orders (From admission, onward)     Start     Ordered   08/30/22 0000  Diet - low sodium heart healthy        08/30/22 1415           Heart healthy DISCHARGE MEDICATION: Allergies as of 08/30/2022   [name] Known Allergies      Medication List     STOP taking these medications    colchicine 0.6 MG tablet   rivaroxaban 20 MG Tabs tablet Commonly known as: XARELTO       TAKE these medications    aspirin EC 81 MG tablet Take 1 tablet (81 mg total) by mouth daily. Swallow whole.   atorvastatin 40 MG tablet Commonly known as: Lipitor Take 1 tablet (40 mg total) by mouth daily.   DULoxetine 30 MG capsule Commonly known as: CYMBALTA Take 1 capsule by mouth daily.   Eliquis 5 MG Tabs tablet Generic drug: apixaban Take 1 tablet (5 mg total) by mouth 2 (two) times daily.   finasteride 5 MG tablet Commonly known as: PROSCAR Take 1 tablet by mouth daily.   gabapentin 300 MG capsule Commonly known as: NEURONTIN Take 300 mg by mouth 3 (three) times daily.   HYDROcodone-acetaminophen 5-325 MG tablet Commonly known as: NORCO/VICODIN Take 1 tablet by mouth every 6 (six) hours as needed for moderate pain.   metFORMIN 500 MG tablet Commonly known as: GLUCOPHAGE Take 500 mg by mouth 2 (two) times daily with a meal.   tamsulosin 0.4 MG Caps capsule Commonly known as: FLOMAX Take 0.4 mg by mouth daily after supper.        Follow-up Information     Dew, Marlow Baars, MD. Schedule an  appointment as soon as possible for a visit in 2 week(s).   Specialties: Vascular Surgery, Radiology, Interventional Cardiology Contact information: [address] [place] [address] [phone]                Discharge Exam: Ceasar Mons Weights   08/27/22 1626 08/28/22 1124  Weight: 99.8 kg 99.8 kg   General: Alert and oriented x3, [name] acute distress Cardiovascular: Regular rate and rhythm, S1-S2 Lungs: Clear to auscultation bilaterally  Condition at discharge: serious  The results of significant diagnostics from this hospitalization (including imaging, microbiology, ancillary and laboratory) are listed below for reference.   Imaging Studies: PERIPHERAL VASCULAR CATHETERIZATION  Result Date: 08/29/2022 See surgical note for result.  PERIPHERAL VASCULAR CATHETERIZATION  Result Date: 08/28/2022 See surgical note for result.  CT ANGIO AO+BIFEM W & OR WO CONTRAST  Result Date: 08/27/2022 CLINICAL DATA:  Leg ischemia. EXAM: CT ANGIOGRAPHY OF ABDOMINAL AORTA WITH ILIOFEMORAL RUNOFF TECHNIQUE: Multidetector CT imaging of the abdomen, pelvis and lower extremities was performed using the standard protocol during bolus administration  of intravenous contrast. Multiplanar CT image reconstructions and MIPs were obtained to evaluate the vascular anatomy. RADIATION DOSE REDUCTION: This exam was performed according to the departmental dose-optimization program which includes automated exposure control, adjustment of the mA and/or kV according to patient size and/or use of iterative reconstruction technique. CONTRAST:  OMNIPAQUE IOHEXOL 350 MG/ML SOLN COMPARISON:  None Available. FINDINGS: VASCULAR Aorta: Normal caliber thoracic aorta with moderate to severe atherosclerotic disease disease. Celiac: Patent without evidence of aneurysm, dissection, vasculitis or significant stenosis. SMA: Patent without evidence of aneurysm, dissection, vasculitis or significant stenosis. Renals: Both renal  arteries are patent without evidence of aneurysm, dissection, vasculitis, fibromuscular dysplasia or significant stenosis. IMA: Patent without evidence of aneurysm, dissection, vasculitis or significant stenosis. RIGHT Lower Extremity Inflow: Common and external iliac arteries are patent without evidence of aneurysm, dissection, vasculitis or significant stenosis. Mild-to-moderate multifocal narrowing of the proximal internal iliac artery and complete occlusion of the distal internal right iliac artery. Outflow: Patent common femoral to popliteal artery bypass graft. Occluded native superficial femoral artery. Profunda femoral artery and popliteal artery are patent without evidence of aneurysm, dissection, vasculitis or significant stenosis. Runoff: Two-vessel runoff to the ankle via the posterior tibia and peroneal arteries. Multifocal intermittent occlusion of the anterior tibial artery. LEFT Lower Extremity Inflow: Common and external iliac arteries are patent without evidence of aneurysm, dissection, vasculitis or significant stenosis. Complete occlusion of the left internal iliac artery. Outflow: Common, profunda, and superficial femoral arteries are patent without evidence of aneurysm, dissection, vasculitis or significant stenosis. Severe narrowing of the mid superficial femoral artery on series 4, image 235 and abrupt occlusion of the distal superficial femoral artery seen on series 4, image 60. Non-opacification of the popliteal artery. Runoff: Non opacification of the calf vessels. Veins: [name] obvious venous abnormality within the limitations of this arterial phase study. Review of the MIP images confirms the above findings. NON-VASCULAR Lower chest: Small hiatal hernia.  [name] acute abnormality. Hepatobiliary: [name] focal liver abnormality is seen. [name] gallstones, gallbladder wall thickening, or biliary dilatation. Pancreas: Unremarkable. [name] pancreatic ductal dilatation or surrounding inflammatory changes. Spleen:  Normal in size without focal abnormality. Adrenals/Urinary Tract: Adrenal glands are unremarkable. Kidneys are normal, without renal calculi, focal lesion, or hydronephrosis. Bladder is unremarkable. Stomach/Bowel: Stomach is within normal limits. Appendix appears normal. [name] evidence of bowel wall thickening, distention, or inflammatory changes. Lymphatic: [name] significant vascular findings are present. [name] enlarged abdominal or pelvic lymph nodes. Reproductive: Prostate is unremarkable. Other: [name] abdominal wall hernia or abnormality. [name] abdominopelvic ascites. Musculoskeletal: [name] acute or significant osseous findings. IMPRESSION: VASCULAR 1. Abrupt occlusion of the distal left superficial femoral artery with non opacification of the left popliteal artery and runoff vessels, concerning for thromboembolic disease. 2. Patent right common femoral to popliteal bypass graft with two runoff to the right ankle. NON-VASCULAR 1. [name] acute abnormality in the abdomen or pelvis. Critical Value/emergent results were called by telephone at the time of interpretation on 08/27/2022 at 8:08 pm to provider [facility] , who verbally acknowledged these results. Electronically Signed   By: Allegra Lai M.D.   On: 08/27/2022 20:24   US Venous Img Lower  Left (DVT Study)  Result Date: 08/27/2022 CLINICAL DATA:  Left leg pain EXAM: Left LOWER EXTREMITY VENOUS DOPPLER ULTRASOUND TECHNIQUE: Gray-scale sonography with compression, as well as color and duplex ultrasound, were performed to evaluate the deep venous system(s) from the level of the common femoral vein through the popliteal and proximal calf veins. COMPARISON:  03/09/2021 FINDINGS: VENOUS Normal compressibility of the common femoral, superficial femoral, and popliteal veins, as well as the visualized calf veins. Visualized portions of profunda femoral vein and great saphenous vein unremarkable. [name] filling defects to suggest DVT on grayscale or color Doppler imaging. Doppler  waveforms show normal direction of venous flow, normal respiratory plasticity and response to augmentation. Limited views of the contralateral common femoral vein are unremarkable. OTHER Suspected thrombus within the popliteal artery. Limitations: none IMPRESSION: Negative for acute left lower extremity DVT. Suspected occlusive thrombus within the left popliteal artery. Suggest further evaluation with angiography. Critical Value/emergent results were called by telephone at the time of interpretation on 08/27/2022 at 6:10 pm to provider PA Darl Pikes for [facility] , who verbally acknowledged these results. Electronically Signed   By: Jasmine Pang M.D.   On: 08/27/2022 18:11    Microbiology: Results for orders placed or performed during the hospital encounter of 08/27/22  MRSA Next Gen by PCR, Nasal     Status: None   Collection Time: 08/28/22  6:45 PM   Specimen: Nasal Mucosa; Nasal Swab  Result Value Ref Range Status   MRSA by PCR Next Gen NOT DETECTED NOT DETECTED Final    Comment: (NOTE) The GeneXpert MRSA Assay (FDA approved for NASAL specimens only), is one component of a comprehensive MRSA colonization surveillance program. It is not intended to diagnose MRSA infection nor to guide or monitor treatment for MRSA infections. Test performance is not FDA approved in patients less than 87 years old. Performed at [facility], [address] [place], [place] [address]     Labs: CBC: Recent Labs  Lab 08/27/22 1825 08/28/22 0517 08/28/22 1441 08/28/22 2007 08/29/22 0405 08/30/22 0429  WBC 6.6 5.1 6.8 6.3 5.4 6.0  NEUTROABS 1.7  --   --   --   --   --   HGB 14.9 14.0 14.0 14.4 13.7 13.2  HCT 42.5 39.4 40.7 42.5 40.3 38.2*  MCV 86.6 87.2 88.3 88.5 89.4 88.6  PLT 170 173 159 141* 115* 111*   Basic Metabolic Panel: Recent Labs  Lab 08/27/22 1825 08/28/22 0517 08/29/22 0405 08/30/22 0429  NA 132* 138 138 135  K 3.7 3.6 4.0 3.9  CL 100 109 107 103  CO2 20* 22 25 25    GLUCOSE 165* 135* 153* 148*  BUN 6 6 7 8   CREATININE 1.04 0.95 1.01 0.95  CALCIUM 8.3* 7.6* 8.0* 7.9*   Liver Function Tests: Recent Labs  Lab 08/27/22 1825  AST 35  ALT 26  ALKPHOS 87  BILITOT 0.9  PROT 7.3  ALBUMIN 3.6   CBG: Recent Labs  Lab 08/29/22 1236 08/29/22 1643 08/29/22 2139 08/30/22 0753 08/30/22 1156  GLUCAP 145* 154* 161* 136* 133*    Discharge time spent: greater than 30 minutes.  Signed: 13/10/23, MD [facility] 08/30/2022

## 2022-08-30 NOTE — Discharge Instructions (Signed)
Agency Name: [facility] Agency Address: [address], [place], [place] [address] Phone: [phone] Website: [email or web address] Service(s) Offered: Housing services, self-sufficiency, congregate meal  program, and individual development account program.  Agency Name: [employer] [facility] [place] [facility] Address: [address], [place], [place] [address] Phone: [phone] Email: [email or web address] Website: [email or web address] Service(s) Offered: Housing the homeless, feeding the hungry, Financial controller, job and education related services.  Agency Name: [facility] Address: [address] [place], [place] [address] Phone: [phone] Email: [email or web address] Service(s) Offered: Counseling, problem pregnancy, advocacy for Hispanics,  limited emergency financial assistance.  Agency Name: [facility] Address: [address] [place], [place] [address] Phone: [phone] Website: [email or web address] Service(s) Offered: Child support services; child welfare services; SNAPS;  Medicaid; work first family assistance; and aid with fuel,  rent, food and medicine.  Agency Name: [facility] Address: [address], [place], [place] [address] Phone: [phone] or [phone] Email: [email or web address] Service(s) Offered: Family services and transient assistance; emergency food,  fuel, clothing, limited furniture, utilities; budget counseling,  general counseling; give a kid a coat; thrift store; Christmas  food and toys. Utility assistance, food pantry, rental  assistance, life sustaining medicine   Food Resources  Agency Name: [facility] Agency Address: [address], [place], [place] [address] Phone: [phone] Website: [email or web address]  Service(s) Offered: Housing services, self-sufficiency, congregate meal  program, weatherization  program, Field seismologist program, emergency food assistance,  housing counseling, home ownership program, wheels -towork program. Meals free for 60 and older at various  locations from 9am-1pm, Monday-Friday:  [employer], [address] [place], [phone] [facility], [address] [place] [phone]   [facility], [address]   [place] [phone] [address], [address]   [place], [phone]  Agency Name: [facility] Address: [phone] [address], [place], [place] [address] Phone: [phone] Website: [email or web address] Service(s) Offered: Home delivered hot, frozen, and emergency  meals. Grocery assistance program which matches  volunteers one-on-one with seniors unable to grocery shop  for themselves. Must be 60 years and older; less than 20  hours of in-home aide service, limited or no driving ability;  live alone or with someone with a disability; live in  [place].  Agency Name: [facility]) Address: [address], [place], [place] [address] Phone: [phone] Service(s) Offered: Food is served to shut-ins, homeless, elderly, and low  income people in the community every Saturday (11:30  am-12:30 pm) and Sunday (12:30 pm-1:30pm). Volunteers  also offer help and encouragement in seeking employment,  and spiritual guidance. February 13, 2017 8  Agency Name: Department of Social Services Address: [address] [place], [place] [address] Phone: [phone] Service(s) Offered: Child support services; child welfare services; food stamps;  Medicaid; work first family assistance; and aid with fuel,  rent, food and medicine.  Agency Name: [employer] Address: [address] [place], [place] Phone: [phone] Website: [email or web address].dreamalign.com Services Offered: Monday 10:00am-12:00, 8:00pm-9:00pm, and  Friday  10:00am-12:00. Agency Name: [employer] [facility] [place] Address: [address], [place], [place] [address] Phone: [phone] Website: [email or web address].org Service(s) Offered: Serves weekday meals, open from 11:30 am- 1:00 pm., and  6:30-7:30pm, Monday-Wednesday-Friday distributes food  3:30-6pm, Monday-Wednesday-Friday.  Agency Name: [facility] Address: [address] [place], [place] Phone: [phone] Website: [email or web address] Services Offered: Distributes food the 4th Saturday of the month, starting at  8:00 am Agency Name: [facility] Address: [phone] [address] [place], [place] [address] Phone: [phone] Website: [email or web address] Service(s) Offered: Bread of life, weekly food pantry. Open Wednesdays from  10:00am-noon.  Agency Name: [facility] [employer] Address: [address] [place], [place], [place] Phone: [phone] Services Offered: Distributes food 9am-1pm, Monday-Thursday. Call for details.   Agency Name: [facility] Address: [address], [place], [place] [address] Phone: [phone] Website: [email or web address] Service(s) Offered: Games developer. Call for assistance. Agency Name: [facility] Address: [address] [place], [place] [address] Phone: [phone] Service Offered: Emergency Food Pantry. Call for appointment.  Agency Name: [facility] Address: [address], [place], [place] [address] Phone: [phone] Website: [email or web address] Services Offered: Games developer. Call for details Agency Name: [facility] Address: [address] [place], [place] Phone: [phone] Website: [email or web address] Service(s) Offered: Emergency Food Pantry. Call for details.  Agency Name: [facility] representative Address: [address] [place], [place] [address] Phone: [phone] or [phone] Website:  [email or web address] Service(s) Offered: Distribute food 9am-11:30 am, Tuesday-Friday, and 1- 3:30pm, Monday-Friday. Food pantry Monday-Friday  1pm-3pm, fresh items, Mon.-Wed.-Fri.  Agency Name: [facility] Address: [address] [place], [place] [address] Phone: [phone] Website: [email or web address] Services Offered: Distribute food Tues and Sats from 9:00am-noon. Closed  1st Saturday of each month. Call for details

## 2022-08-30 NOTE — Progress Notes (Signed)
I was contacted this afternoon by Byrd Hesselbach registered nurse and ICU has been taking care of Mr. Coone.  She states that in the past hour he has required 4 mg of IV morphine for pain to his left leg.  She also states that she is unable to get Doppler pulses in his left leg.  However she was able to get pulses in his right leg.  Upon evaluation of Mr. Salzman left leg I found marked changes from my exam this morning.  His left foot to his knee is now cold and I am unable to get Doppler pulses from his popliteal pulse to his toes.  He has resting pain requiring pain medication. I contacted Ms. Sheppard Plumber, NP who in turn contacted Dr. Festus Barren.  Upon discussion of the patient's current condition with Dr. Wyn Quaker, and after a lysis catheter overnight, Aggrastat for 24 hours, and the restart of Xarelto his blood thinner, there is no other intervention we can offer at this time to restore blood flow to his left lower extremity.  Ms. Sheppard Plumber and myself had a long conversation with the patient regarding the change in condition of his left leg.  At this time with Dr. Kristeen Mans direction we can only offer him a below the knee amputation.  The surgery was discussed in detail with the risks and the benefits explained to him going forward.  All of the patient's questions were answered.  The patient would rather go home at this point in time on his blood thinners and deal with the pain until he can make a decision regarding having below the knee amputation of his left leg.He was told to contact after he had made a decision.   Dr. Rito Ehrlich MD from his primary team was made aware of the patient's decision to go home today and follow-up with vein and vascular surgery in 2 to 3 weeks.  Patient will be discharged later this afternoon.

## 2022-08-30 NOTE — Hospital Course (Signed)
61 year old male with past medical history of stage II chronic kidney disease, diabetes mellitus type 2, peripheral neuropathy, BPH and hepatitis C as well as PAD status post recent thrombectomy of the right lower extremity in June of this year who presented to the emergency room on 11/7 with acute onset of worsening left foot pain x3 days.  In taking of history, patient was noted to have missed several days of Xarelto from the previous week due to cost.  Vascular surgery was consulted and patient underwent angiogram on 11/9 with stent placement x2 to the left SFA and most proximal popliteal artery as well as percutaneous angioplasty of the left peroneal artery and left tibioperoneal trunk.  Postprocedure, patient was monitored closely in the ICU.  Transitions of care was able to arrange patient to have Eliquis, which was delivered to his bedside with plans to discharge patient on 11/10.  Prior to discharge, patient started having increased pain and coolness of the left leg again.  Patient seen by vascular surgery immediately and felt that little could be done to save his leg and that he would need a left BKA.  When this was discussed with the patient, he refused and wanted to go home on Eliquis.  Patient given prescription for additional pain medication and will follow-up with vascular surgery in 2 weeks.

## 2022-08-30 NOTE — TOC Transition Note (Signed)
Transition of Care [facility]) - CM/SW Discharge Note   Patient Details  Name: Vincent [name] MRN: [id number] Date of Birth: 1960-12-16  Transition of Care [facility]) CM/SW Contact:  Allayne Butcher, RN Phone Number: 08/30/2022, 4:10 PM   Clinical Narrative:    Patient does not want to stay, does not want to have amputation, he will follow up with Vascular surgery outpatient in 2 weeks.  DC home.  Safe transport arranged to arrive after 5 pm.     Final next level of care: Home/Self Care Barriers to Discharge: Barriers Resolved   Patient Goals and CMS Choice Patient states their goals for this hospitalization and ongoing recovery are:: wants to go home      Discharge Placement                       Discharge Plan and Services   Discharge Planning Services: CM Consult, Medication Assistance, Indigent Health Clinic            DME Arranged: N/A DME Agency: NA       HH Arranged: NA HH Agency: NA        Social Determinants of Health (SDOH) Interventions     Readmission Risk Interventions     No data to display

## 2022-08-30 NOTE — Progress Notes (Signed)
After speaking to the MD about the need of BKA pt requested to go home and think about what is best for hime, with a follow up. Education about possible complications were provided, as long with the AVS [paper. Pt was told to use Mychart for any needs, to call doctor in case of worsening. Pt acknowledge understanding.. All medications for today were given, including PRNs. . All IVs were removed and intact. VS stable. Medications from pharmacy with the patient, whi was transferred from the unit to DC home on provided car transportation.

## 2022-09-10 ENCOUNTER — Other Ambulatory Visit: Payer: Self-pay

## 2022-12-29 ENCOUNTER — Emergency Department
Admission: EM | Admit: 2022-12-29 | Discharge: 2022-12-29 | Disposition: A | Discharge status: Home / Self Care | Payer: Medicaid Other | Attending: Emergency Medicine | Admitting: Emergency Medicine

## 2022-12-29 ENCOUNTER — Emergency Department: Payer: Medicaid Other

## 2022-12-29 ENCOUNTER — Other Ambulatory Visit: Payer: Self-pay

## 2022-12-29 ENCOUNTER — Encounter: Payer: Self-pay | Admitting: Radiology

## 2022-12-29 DIAGNOSIS — M792 Neuralgia and neuritis, unspecified: Secondary | ICD-10-CM | POA: Diagnosis present

## 2022-12-29 MED ORDER — GABAPENTIN 300 MG PO CAPS
300.0000 mg | ORAL_CAPSULE | Freq: Once | ORAL | Status: AC
  Administered 2022-12-29: 300 mg via ORAL
  Filled 2022-12-29: qty 1

## 2022-12-29 MED ORDER — GABAPENTIN 300 MG PO CAPS: 300.0000 mg | ORAL_CAPSULE | Freq: Three times a day (TID) | ORAL | 2 refills | Status: DC

## 2022-12-29 MED ORDER — GABAPENTIN 300 MG PO CAPS
300.0000 mg | ORAL_CAPSULE | Freq: Three times a day (TID) | ORAL | 2 refills | Status: DC
  Filled 2022-12-29: qty 90, 30d supply, fill #0

## 2022-12-29 NOTE — ED Triage Notes (Signed)
First Nurse: Pt is coming from home via [facility] due to bilateral leg pain. Pt had recent BKA.  BP 145/70 98% HR 75 CBG 159

## 2022-12-29 NOTE — ED Provider Notes (Signed)
[facility] Provider Note    Event Date/Time   First MD Initiated Contact with Patient 12/29/22 1555     (approximate)   History      HPI  NICK JANSA is a 62 y.o. male who had a left above-the-knee amputation in December, presents because he is having significant sensitivity.  He reports that even if he puts his feet over his left leg he has pain     Physical Exam   Triage Vital Signs: ED Triage Vitals  Enc Vitals Group     BP 12/29/22 1537 108/70     Pulse Rate 12/29/22 1537 80     Resp 12/29/22 1537 16     Temp 12/29/22 1537 97.9 F (36.6 C)     Temp Source 12/29/22 1537 Oral     SpO2 12/29/22 1537 100 %     Weight 12/29/22 1541 93 kg (205 lb)     Height 12/29/22 1541 1.981 m ('6\' 6"'$ )     Head Circumference --      Peak Flow --      Pain Score 12/29/22 1644 6     Pain Loc --      Pain Edu? --      Excl. in [place]? --     Most recent vital signs: Vitals:   12/29/22 1537 12/29/22 1624  BP: 108/70 106/65  Pulse: 80 85  Resp: 16 19  Temp: 97.9 F (36.6 C)   SpO2: 100% 98%     General: Awake, no distress.  CV:  Good peripheral perfusion.  Resp:  Normal effort.  Abd:  No distention.  Other:  Left stump: Well-healed, no evidence of infection, no discharge, no erythema, no fluctuance but even light touch makes the patient jerked   ED Results / Procedures / Treatments   Labs (all labs ordered are listed, but only abnormal results are displayed) Labs Reviewed - No data to display   EKG     RADIOLOGY Femur x-ray viewed interpret by me, no abnormality    PROCEDURES:  Critical Care performed:   Procedures   MEDICATIONS ORDERED IN ED: Medications  gabapentin (NEURONTIN) capsule 300 mg (300 mg Oral Given 12/29/22 1631)     IMPRESSION / MDM / [place] / ED COURSE  I reviewed the triage vital signs and the nursing notes. Patient's presentation is most consistent with acute presentation with potential  threat to life or bodily function.  Patient presents with left stump pain as above, includes infection, osteomyelitis however overall exam is quite reassuring, no evidence of cellulitis fluctuance dehiscence  I suspect the patient is having nerve pain, will start him on gabapentin, he reports the symptoms been ongoing for several weeks, appropriate for outpatient follow-up with his surgeon        FINAL CLINICAL IMPRESSION(S) / ED DIAGNOSES   Final diagnoses:  Nerve pain     Rx / DC Orders   ED Discharge Orders          Ordered    gabapentin (NEURONTIN) 300 MG capsule  3 times daily,   Status:  Discontinued        12/29/22 1629    gabapentin (NEURONTIN) 300 MG capsule  3 times daily        12/29/22 1630             Note:  This document was prepared using Dragon voice recognition software and may include unintentional dictation errors.   Lavonia Drafts,  MD 12/29/22 1708

## 2022-12-29 NOTE — ED Triage Notes (Signed)
Pt states he is having left stump pain. Pt with an left above the knee amputation that was done in December. Pt still have a wound noted to the incision line.

## 2022-12-29 NOTE — ED Notes (Signed)
XR at bedside

## 2023-07-03 ENCOUNTER — Emergency Department
Admission: EM | Admit: 2023-07-03 | Discharge: 2023-07-03 | Disposition: A | Discharge status: Home / Self Care | Payer: Medicaid Other | Attending: Student in an Organized Health Care Education/Training Program | Admitting: Student in an Organized Health Care Education/Training Program

## 2023-07-03 ENCOUNTER — Emergency Department: Payer: Medicaid Other

## 2023-07-03 DIAGNOSIS — M7989 Other specified soft tissue disorders: Secondary | ICD-10-CM | POA: Insufficient documentation

## 2023-07-03 DIAGNOSIS — E119 Type 2 diabetes mellitus without complications: Secondary | ICD-10-CM | POA: Insufficient documentation

## 2023-07-03 DIAGNOSIS — Z7984 Long term (current) use of oral hypoglycemic drugs: Secondary | ICD-10-CM | POA: Insufficient documentation

## 2023-07-03 DIAGNOSIS — Z7901 Long term (current) use of anticoagulants: Secondary | ICD-10-CM | POA: Insufficient documentation

## 2023-07-03 DIAGNOSIS — T8789 Other complications of amputation stump: Secondary | ICD-10-CM | POA: Diagnosis present

## 2023-07-03 DIAGNOSIS — Z7982 Long term (current) use of aspirin: Secondary | ICD-10-CM | POA: Diagnosis not present

## 2023-07-03 DIAGNOSIS — L89899 Pressure ulcer of other site, unspecified stage: Secondary | ICD-10-CM

## 2023-07-03 DIAGNOSIS — I10 Essential (primary) hypertension: Secondary | ICD-10-CM | POA: Insufficient documentation

## 2023-07-03 LAB — CBC
HCT: 40.4 % (ref 39.0–52.0)
Hemoglobin: 13.6 g/dL (ref 13.0–17.0)
MCH: 26.2 pg (ref 26.0–34.0)
MCHC: 33.7 g/dL (ref 30.0–36.0)
MCV: 77.8 fL — ABNORMAL LOW (ref 80.0–100.0)
Platelets: 201 10*3/uL (ref 150–400)
RBC: 5.19 MIL/uL (ref 4.22–5.81)
RDW: 16.4 % — ABNORMAL HIGH (ref 11.5–15.5)
WBC: 5.5 10*3/uL (ref 4.0–10.5)
nRBC: 0 % (ref 0.0–0.2)

## 2023-07-03 LAB — BASIC METABOLIC PANEL
Anion gap: 11 (ref 5–15)
BUN: 10 mg/dL (ref 8–23)
CO2: 24 mmol/L (ref 22–32)
Calcium: 9.2 mg/dL (ref 8.9–10.3)
Chloride: 98 mmol/L (ref 98–111)
Creatinine, Ser: 0.87 mg/dL (ref 0.61–1.24)
GFR, Estimated: >60 mL/min (ref >60)
Glucose, Bld: 94 mg/dL (ref 70–99)
Potassium: 4.2 mmol/L (ref 3.5–5.1)
Sodium: 133 mmol/L — ABNORMAL LOW (ref 135–145)

## 2023-07-03 LAB — LACTIC ACID, PLASMA: Lactic Acid, Venous: 1.6 mmol/L (ref 0.5–1.9)

## 2023-07-03 MED ORDER — OXYCODONE-ACETAMINOPHEN 5-325 MG PO TABS
1.0000 | ORAL_TABLET | Freq: Once | ORAL | Status: AC
  Administered 2023-07-03: 1 via ORAL
  Filled 2023-07-03: qty 1

## 2023-07-03 MED ORDER — SULFAMETHOXAZOLE-TRIMETHOPRIM 800-160 MG PO TABS
1.0000 | ORAL_TABLET | Freq: Once | ORAL | Status: AC
  Administered 2023-07-03: 1 via ORAL
  Filled 2023-07-03: qty 1

## 2023-07-03 MED ORDER — SULFAMETHOXAZOLE-TRIMETHOPRIM 800-160 MG PO TABS: 1.0000 | ORAL_TABLET | Freq: Two times a day (BID) | ORAL | 0 refills | Status: DC

## 2023-07-03 MED ORDER — OXYCODONE HCL 5 MG PO TABS
5.0000 mg | ORAL_TABLET | Freq: Three times a day (TID) | ORAL | 0 refills | Status: DC | PRN
Start: 2023-07-03 — End: 2023-10-31

## 2023-07-03 NOTE — ED Notes (Signed)
Patient off the floor.

## 2023-07-03 NOTE — ED Triage Notes (Signed)
First nurse note: L leg wound pt here for a wound check. Pt had recent AKA. EMS denies fevers.

## 2023-07-03 NOTE — ED Provider Notes (Signed)
[place] EMERGENCY DEPARTMENT AT [facility] Provider Note   CSN: [id number] Arrival date & time: 07/03/23  1548     History  Chief Complaint  Patient presents with   Leg Pain    Vincent [name] is a 62 y.o. male with a history of bilateral above-knee amputations, type 2 diabetes, hyperlipidemia, hypertension, peripheral arterial disease presents to the emergency department valuation of wound to the left leg.  Left leg above-knee amputation was performed months ago.  He has been doing well up until recently over the last few weeks has had a ulcer/wound along the distal portion of the left amputation site.  Patient has been treating this with gauze dressings.  He denies any warmth redness or fevers but has had subtle clear discharge.  He is painful along the region of the wound.  HPI     Home Medications Prior to Admission medications   Medication Sig Start Date End Date Taking? Authorizing Provider  oxyCODONE (ROXICODONE) 5 MG immediate release tablet Take 1 tablet (5 mg total) by mouth every 8 (eight) hours as needed. 07/03/23 07/02/24 Yes Evon Slack, PA-C  sulfamethoxazole-trimethoprim (BACTRIM DS) 800-160 MG tablet Take 1 tablet by mouth 2 (two) times daily. 07/03/23  Yes Evon Slack, PA-C  apixaban (ELIQUIS) 5 MG TABS tablet Take 1 tablet (5 mg total) by mouth 2 (two) times daily. 08/30/22   Hollice Espy, MD  aspirin EC 81 MG EC tablet Take 1 tablet (81 mg total) by mouth daily. Swallow whole. 03/11/21   Tresa Moore, MD  atorvastatin (LIPITOR) 40 MG tablet Take 1 tablet (40 mg total) by mouth daily. 08/30/22   Hollice Espy, MD  DULoxetine (CYMBALTA) 30 MG capsule Take 1 capsule by mouth daily. 03/29/22   [provider]  gabapentin (NEURONTIN) 300 MG capsule Take 1 capsule (300 mg total) by mouth 3 (three) times daily. 12/29/22 12/29/23  Jene Every, MD  HYDROcodone-acetaminophen (NORCO/VICODIN) 5-325 MG tablet Take 1 tablet by mouth  every 6 (six) hours as needed for moderate pain. 08/30/22 08/30/23  Hollice Espy, MD  metFORMIN (GLUCOPHAGE) 500 MG tablet Take 500 mg by mouth 2 (two) times daily with a meal. 03/28/22   [provider]      Allergies    Patient has no known allergies.    Review of Systems   Review of Systems  Physical Exam Updated Vital Signs BP 138/79 (BP Location: Left Arm)   Pulse 72   Temp 98 F (36.7 C) (Oral)   Resp 16   Wt 72.6 kg   SpO2 99%   BMI 18.49 kg/m  Physical Exam Constitutional:      Appearance: He is well-developed.  HENT:     Head: Normocephalic and atraumatic.  Eyes:     Conjunctiva/sclera: Conjunctivae normal.  Cardiovascular:     Rate and Rhythm: Normal rate.  Pulmonary:     Effort: Pulmonary effort is normal. No respiratory distress.  Musculoskeletal:        General: Normal range of motion.     Cervical back: Normal range of motion.     Comments: Bilateral above-knee amputations.  Left distal femur along the tip shows a 2-1/2 x 2-1/2 cm shallow ulceration.  No exposed bone.  There is no purulence, bleeding. There is no fluctuance, induration, warmth or redness.  He is tender to palpation along the area of the wound.  Skin:    General: Skin is warm.     Findings:  No rash.  Neurological:     Mental Status: He is alert and oriented to person, place, and time.  Psychiatric:        Mood and Affect: Mood normal.        Behavior: Behavior normal.        Thought Content: Thought content normal.     ED Results / Procedures / Treatments   Labs (all labs ordered are listed, but only abnormal results are displayed) Labs Reviewed  CBC - Abnormal; Notable for the following components:      Result Value   MCV 77.8 (*)    RDW 16.4 (*)    All other components within normal limits  BASIC METABOLIC PANEL - Abnormal; Notable for the following components:   Sodium 133 (*)    All other components within normal limits  LACTIC ACID, PLASMA     EKG None  Radiology DG Femur Min 2 Views Left  Result Date: 07/03/2023 CLINICAL DATA:  Amputation of left lower extremity, possible osteo. EXAM: LEFT FEMUR 2 VIEWS COMPARISON:  Left femoral x-ray 12/29/2022 FINDINGS: Patient is status post above-the-knee amputation, unchanged. There is no significant overlying soft tissue swelling of the amputation site. There some mild irregularity of the surgical margin of the distal femur when compared to the prior study, nonspecific. Vascular stent is again noted in the soft tissues. Peripheral vascular calcifications are present. No acute fracture or dislocation. There are mild degenerative changes of the left hip. IMPRESSION: 1. Mild irregularity of the surgical margin of the distal femur when compared to the prior study, nonspecific. If there is clinical concern for osteomyelitis, consider MRI for further evaluation. 2. Status post above-the-knee amputation. Electronically Signed   By: Darliss Cheney M.D.   On: 07/03/2023 20:22    Procedures Procedures    Medications Ordered in ED Medications  sulfamethoxazole-trimethoprim (BACTRIM DS) 800-160 MG per tablet 1 tablet (has no administration in time range)  oxyCODONE-acetaminophen (PERCOCET/ROXICET) 5-325 MG per tablet 1 tablet (1 tablet Oral Given 07/03/23 1904)    ED Course/ Medical Decision Making/ A&P                                 Medical Decision Making Amount and/or Complexity of Data Reviewed Labs: ordered. Radiology: ordered.  Risk Prescription drug management.   62 year old male with above-knee amputation bilateral lower extremities.  Most recent amputation of the right lower extremity is doing well and healing but he has been having soreness to the left stump for several weeks with a shallow ulceration/wound.  There is no purulent drainage, warmth or redness.  He is having pain to the area.  He denies any falls trauma or injury.  No fevers chills or bodyaches.  His vital signs are  stable.  He is afebrile.  CBC, BMP and lactic all within normal limits.  His x-rays show no signs of definite osteomyelitis.  Patient stable and ready for discharge to home.  Will place on Bactrim DS twice daily for 7 days.  I recommend patient follow-up with surgeon first of next week.  I am concerned about wound and potential for worsening/distal development of infection.  Patient is educated on need for follow-up and understands strict return precautions. Final Clinical Impression(s) / ED Diagnoses Final diagnoses:   pressure ulcer of above-knee amputation stump    Rx / DC Orders ED Discharge Orders          Ordered  oxyCODONE (ROXICODONE) 5 MG immediate release tablet  Every 8 hours PRN        07/03/23 2051    sulfamethoxazole-trimethoprim (BACTRIM DS) 800-160 MG tablet  2 times daily        07/03/23 2051              Ronnette Juniper 07/03/23 2055    Willy Eddy, MD 07/05/23 1108

## 2023-07-03 NOTE — Discharge Instructions (Signed)
Please keep wound site clean covered and dry.  Take antibiotic as prescribed.  Follow-up with surgeon first of next week for recheck.  Return to the ER for any fevers, worsening pain, urgent changes in your health avoid placing prosthesis on lower leg to prevent worsening skin irritation

## 2023-07-03 NOTE — ED Triage Notes (Signed)
Pt reports getting bilateral leg amputation 3 weeks ago and is complaining about a wound on the left leg. Pt denies fevers, NVD. Pt A&Ox4. Pt had the amputations due to diabetes. Pt states that the pain is about the same before the surgery.

## 2023-10-29 ENCOUNTER — Encounter: Payer: Self-pay | Admitting: Emergency Medicine

## 2023-10-29 ENCOUNTER — Emergency Department: Payer: Medicaid Other

## 2023-10-29 ENCOUNTER — Other Ambulatory Visit: Payer: Self-pay

## 2023-10-29 ENCOUNTER — Inpatient Hospital Stay
Admission: EM | Admit: 2023-10-29 | Discharge: 2023-10-31 | DRG: 440 | Disposition: A | Discharge status: Home / Self Care | Payer: Medicaid Other | Attending: Internal Medicine | Admitting: Internal Medicine

## 2023-10-29 DIAGNOSIS — R8290 Unspecified abnormal findings in urine: Secondary | ICD-10-CM | POA: Insufficient documentation

## 2023-10-29 DIAGNOSIS — Z7984 Long term (current) use of oral hypoglycemic drugs: Secondary | ICD-10-CM | POA: Diagnosis not present

## 2023-10-29 DIAGNOSIS — Z720 Tobacco use: Secondary | ICD-10-CM | POA: Diagnosis not present

## 2023-10-29 DIAGNOSIS — I2510 Atherosclerotic heart disease of native coronary artery without angina pectoris: Secondary | ICD-10-CM | POA: Diagnosis present

## 2023-10-29 DIAGNOSIS — Z7901 Long term (current) use of anticoagulants: Secondary | ICD-10-CM | POA: Diagnosis not present

## 2023-10-29 DIAGNOSIS — Z7982 Long term (current) use of aspirin: Secondary | ICD-10-CM

## 2023-10-29 DIAGNOSIS — R748 Abnormal levels of other serum enzymes: Secondary | ICD-10-CM | POA: Diagnosis present

## 2023-10-29 DIAGNOSIS — I480 Paroxysmal atrial fibrillation: Secondary | ICD-10-CM | POA: Diagnosis present

## 2023-10-29 DIAGNOSIS — Z833 Family history of diabetes mellitus: Secondary | ICD-10-CM

## 2023-10-29 DIAGNOSIS — M109 Gout, unspecified: Secondary | ICD-10-CM | POA: Diagnosis present

## 2023-10-29 DIAGNOSIS — R911 Solitary pulmonary nodule: Secondary | ICD-10-CM | POA: Diagnosis present

## 2023-10-29 DIAGNOSIS — E1122 Type 2 diabetes mellitus with diabetic chronic kidney disease: Secondary | ICD-10-CM | POA: Diagnosis present

## 2023-10-29 DIAGNOSIS — F17210 Nicotine dependence, cigarettes, uncomplicated: Secondary | ICD-10-CM | POA: Diagnosis present

## 2023-10-29 DIAGNOSIS — Z716 Tobacco abuse counseling: Secondary | ICD-10-CM | POA: Diagnosis not present

## 2023-10-29 DIAGNOSIS — Z89611 Acquired absence of right leg above knee: Secondary | ICD-10-CM | POA: Diagnosis not present

## 2023-10-29 DIAGNOSIS — I129 Hypertensive chronic kidney disease with stage 1 through stage 4 chronic kidney disease, or unspecified chronic kidney disease: Secondary | ICD-10-CM | POA: Diagnosis present

## 2023-10-29 DIAGNOSIS — K859 Acute pancreatitis without necrosis or infection, unspecified: Secondary | ICD-10-CM | POA: Diagnosis present

## 2023-10-29 DIAGNOSIS — Z89612 Acquired absence of left leg above knee: Secondary | ICD-10-CM | POA: Diagnosis not present

## 2023-10-29 DIAGNOSIS — Z79899 Other long term (current) drug therapy: Secondary | ICD-10-CM

## 2023-10-29 DIAGNOSIS — E1142 Type 2 diabetes mellitus with diabetic polyneuropathy: Secondary | ICD-10-CM | POA: Diagnosis present

## 2023-10-29 DIAGNOSIS — F1010 Alcohol abuse, uncomplicated: Secondary | ICD-10-CM | POA: Diagnosis present

## 2023-10-29 DIAGNOSIS — J449 Chronic obstructive pulmonary disease, unspecified: Secondary | ICD-10-CM | POA: Diagnosis present

## 2023-10-29 DIAGNOSIS — K852 Alcohol induced acute pancreatitis without necrosis or infection: Secondary | ICD-10-CM | POA: Diagnosis present

## 2023-10-29 DIAGNOSIS — E119 Type 2 diabetes mellitus without complications: Secondary | ICD-10-CM | POA: Diagnosis not present

## 2023-10-29 LAB — URINALYSIS, ROUTINE W REFLEX MICROSCOPIC
Bilirubin Urine: NEGATIVE
Glucose, UA: NEGATIVE mg/dL
Ketones, ur: 20 mg/dL — AB
Nitrite: POSITIVE — AB
Protein, ur: NEGATIVE mg/dL
Specific Gravity, Urine: 1.043 — ABNORMAL HIGH (ref 1.005–1.030)
WBC, UA: >50 WBC/hpf (ref 0–5)
pH: 5 (ref 5.0–8.0)

## 2023-10-29 LAB — CBC
HCT: 49.4 % (ref 39.0–52.0)
HCT: 46 % (ref 39.0–52.0)
Hemoglobin: 16.6 g/dL (ref 13.0–17.0)
Hemoglobin: 15.5 g/dL (ref 13.0–17.0)
MCH: 28.7 pg (ref 26.0–34.0)
MCH: 29.2 pg (ref 26.0–34.0)
MCHC: 33.6 g/dL (ref 30.0–36.0)
MCHC: 33.7 g/dL (ref 30.0–36.0)
MCV: 85.5 fL (ref 80.0–100.0)
MCV: 86.8 fL (ref 80.0–100.0)
Platelets: 218 10*3/uL (ref 150–400)
Platelets: 196 10*3/uL (ref 150–400)
RBC: 5.78 MIL/uL (ref 4.22–5.81)
RBC: 5.3 MIL/uL (ref 4.22–5.81)
RDW: 15.8 % — ABNORMAL HIGH (ref 11.5–15.5)
RDW: 15.8 % — ABNORMAL HIGH (ref 11.5–15.5)
WBC: 7.5 10*3/uL (ref 4.0–10.5)
WBC: 8 10*3/uL (ref 4.0–10.5)
nRBC: 0 % (ref 0.0–0.2)
nRBC: 0 % (ref 0.0–0.2)

## 2023-10-29 LAB — COMPREHENSIVE METABOLIC PANEL
ALT: 23 U/L (ref 0–44)
AST: 29 U/L (ref 15–41)
Albumin: 3.8 g/dL (ref 3.5–5.0)
Alkaline Phosphatase: 91 U/L (ref 38–126)
Anion gap: 18 — ABNORMAL HIGH (ref 5–15)
BUN: 11 mg/dL (ref 8–23)
CO2: 21 mmol/L — ABNORMAL LOW (ref 22–32)
Calcium: 9.8 mg/dL (ref 8.9–10.3)
Chloride: 100 mmol/L (ref 98–111)
Creatinine, Ser: 1.01 mg/dL (ref 0.61–1.24)
GFR, Estimated: >60 mL/min (ref >60)
Glucose, Bld: 202 mg/dL — ABNORMAL HIGH (ref 70–99)
Potassium: 4 mmol/L (ref 3.5–5.1)
Sodium: 139 mmol/L (ref 135–145)
Total Bilirubin: 1.3 mg/dL — ABNORMAL HIGH (ref 0.0–1.2)
Total Protein: 7.9 g/dL (ref 6.5–8.1)

## 2023-10-29 LAB — LIPASE, BLOOD: Lipase: 311 U/L — ABNORMAL HIGH (ref 11–51)

## 2023-10-29 LAB — LACTIC ACID, PLASMA
Lactic Acid, Venous: 1.8 mmol/L (ref 0.5–1.9)
Lactic Acid, Venous: 1.4 mmol/L (ref 0.5–1.9)

## 2023-10-29 LAB — CREATININE, SERUM
Creatinine, Ser: 0.97 mg/dL (ref 0.61–1.24)
GFR, Estimated: >60 mL/min (ref >60)

## 2023-10-29 MED ORDER — IPRATROPIUM-ALBUTEROL 0.5-2.5 (3) MG/3ML IN SOLN: 3.0000 mL | Freq: Four times a day (QID) | RESPIRATORY_TRACT | Status: DC | PRN

## 2023-10-29 MED ORDER — METOCLOPRAMIDE HCL 5 MG/ML IJ SOLN
10.0000 mg | Freq: Once | INTRAMUSCULAR | Status: AC
  Administered 2023-10-29: 10 mg via INTRAVENOUS
  Filled 2023-10-29: qty 2

## 2023-10-29 MED ORDER — LORAZEPAM 1 MG PO TABS: 1.0000 mg | ORAL_TABLET | ORAL | Status: DC | PRN

## 2023-10-29 MED ORDER — FENTANYL CITRATE PF 50 MCG/ML IJ SOSY
50.0000 ug | PREFILLED_SYRINGE | INTRAMUSCULAR | Status: DC | PRN
  Administered 2023-10-29: 50 ug via INTRAVENOUS
  Filled 2023-10-29: qty 1

## 2023-10-29 MED ORDER — HYDRALAZINE HCL 20 MG/ML IJ SOLN: 10.0000 mg | Freq: Four times a day (QID) | INTRAMUSCULAR | Status: DC | PRN

## 2023-10-29 MED ORDER — MORPHINE SULFATE (PF) 2 MG/ML IV SOLN
2.0000 mg | INTRAVENOUS | Status: DC | PRN
  Administered 2023-10-30 – 2023-10-31 (×5): 2 mg via INTRAVENOUS
  Filled 2023-10-29 (×5): qty 1

## 2023-10-29 MED ORDER — ONDANSETRON HCL 4 MG/2ML IJ SOLN
4.0000 mg | Freq: Four times a day (QID) | INTRAMUSCULAR | Status: DC | PRN
  Administered 2023-10-30: 4 mg via INTRAVENOUS
  Filled 2023-10-29: qty 2

## 2023-10-29 MED ORDER — MORPHINE SULFATE (PF) 4 MG/ML IV SOLN
4.0000 mg | INTRAVENOUS | Status: DC | PRN
  Administered 2023-10-29: 4 mg via INTRAVENOUS
  Filled 2023-10-29: qty 1

## 2023-10-29 MED ORDER — IOHEXOL 350 MG/ML SOLN
100.0000 mL | Freq: Once | INTRAVENOUS | Status: AC | PRN
  Administered 2023-10-29: 100 mL via INTRAVENOUS

## 2023-10-29 MED ORDER — LORAZEPAM 2 MG/ML IJ SOLN: 0.0000 mg | Freq: Two times a day (BID) | INTRAMUSCULAR | Status: DC

## 2023-10-29 MED ORDER — ONDANSETRON HCL 4 MG/2ML IJ SOLN
4.0000 mg | Freq: Once | INTRAMUSCULAR | Status: AC
  Administered 2023-10-29: 4 mg via INTRAVENOUS
  Filled 2023-10-29: qty 2

## 2023-10-29 MED ORDER — ENOXAPARIN SODIUM 40 MG/0.4ML IJ SOSY
40.0000 mg | PREFILLED_SYRINGE | INTRAMUSCULAR | Status: DC
  Administered 2023-10-30 – 2023-10-31 (×2): 40 mg via SUBCUTANEOUS
  Filled 2023-10-29 (×2): qty 0.4

## 2023-10-29 MED ORDER — SODIUM CHLORIDE 0.9 % IV BOLUS
1000.0000 mL | Freq: Once | INTRAVENOUS | Status: AC
  Administered 2023-10-29: 1000 mL via INTRAVENOUS

## 2023-10-29 MED ORDER — LORAZEPAM 2 MG/ML IJ SOLN: 0.0000 mg | Freq: Four times a day (QID) | INTRAMUSCULAR | Status: AC

## 2023-10-29 MED ORDER — SODIUM CHLORIDE 0.9 % IV BOLUS
2000.0000 mL | Freq: Once | INTRAVENOUS | Status: AC
  Administered 2023-10-29: 2000 mL via INTRAVENOUS

## 2023-10-29 MED ORDER — INSULIN ASPART 100 UNIT/ML IJ SOLN
0.0000 [IU] | Freq: Three times a day (TID) | INTRAMUSCULAR | Status: DC
  Administered 2023-10-30: 2 [IU] via SUBCUTANEOUS
  Administered 2023-10-31: 3 [IU] via SUBCUTANEOUS
  Filled 2023-10-29 (×2): qty 1

## 2023-10-29 MED ORDER — LORAZEPAM 2 MG/ML IJ SOLN
1.0000 mg | INTRAMUSCULAR | Status: DC | PRN
Start: 2023-10-29 — End: 2023-11-01

## 2023-10-29 NOTE — ED Triage Notes (Signed)
 Pt via POV from home. Pt c/o generalized abd pain for the past 2 days. Denies any NVD. Denies fever. Denies hx of abd surgeries. Pt is A&Ox4 and NAD

## 2023-10-29 NOTE — ED Notes (Signed)
 Pt returned from CT

## 2023-10-29 NOTE — ED Provider Notes (Signed)
 [facility] Provider Note    Event Date/Time   First MD Initiated Contact with Patient 10/29/23 1528     (approximate)   History   Abdominal Pain   HPI  Vincent [name] is a 63 y.o. male   with a history of CKD, COPD, CAD, diabetes, alcohol abuse status post BKA due to PAD presents to the ER for evaluation of epigastric abdominal pain radiating to the flanks.  Denies any known history of pancreatitis but does admit to drinking quite a bit of alcohol over the past few days.  He denies any fevers.      Physical Exam   Triage Vital Signs: ED Triage Vitals  Encounter Vitals Group     BP 10/29/23 1206 (!) 151/88     Systolic BP Percentile --      Diastolic BP Percentile --      Pulse Rate 10/29/23 1206 74     Resp 10/29/23 1206 18     Temp 10/29/23 1206 97.6 F (36.4 C)     Temp Source 10/29/23 1206 Oral     SpO2 10/29/23 1206 100 %     Weight 10/29/23 1205 150 lb (68 kg)     Height 10/29/23 1205 6' 6 (1.981 m)     Head Circumference --      Peak Flow --      Pain Score 10/29/23 1205 10     Pain Loc --      Pain Education --      Exclude from Growth Chart --     Most recent vital signs: Vitals:   10/29/23 1206 10/29/23 1601  BP: (!) 151/88 (!) 150/113  Pulse: 74 79  Resp: 18 18  Temp: 97.6 F (36.4 C)   SpO2: 100% 100%     Constitutional: Alert  Eyes: Conjunctivae are normal.  Head: Atraumatic. Nose: No congestion/rhinnorhea. Mouth/Throat: Mucous membranes are moist.   Neck: Painless ROM.  Cardiovascular:   Good peripheral circulation. Respiratory: Normal respiratory effort.  No retractions.  Gastrointestinal: Soft but with diffuse tenderness to palpation.  No guarding or rebound tenderness. Musculoskeletal:  bilat aka Neurologic:  MAE spontaneously. No gross focal neurologic deficits are appreciated.  Skin:  Skin is warm, dry and intact. No rash noted. Psychiatric: Mood and affect are normal. Speech and behavior are  normal.    ED Results / Procedures / Treatments   Labs (all labs ordered are listed, but only abnormal results are displayed) Labs Reviewed  LIPASE, BLOOD - Abnormal; Notable for the following components:      Result Value   Lipase 311 (*)    All other components within normal limits  COMPREHENSIVE METABOLIC PANEL - Abnormal; Notable for the following components:   CO2 21 (*)    Glucose, Bld 202 (*)    Total Bilirubin 1.3 (*)    Anion gap 18 (*)    All other components within normal limits  CBC - Abnormal; Notable for the following components:   RDW 15.8 (*)    All other components within normal limits  LACTIC ACID, PLASMA  URINALYSIS, ROUTINE W REFLEX MICROSCOPIC  LACTIC ACID, PLASMA     EKG     RADIOLOGY Please see ED Course for my review and interpretation.  I personally reviewed all radiographic images ordered to evaluate for the above acute complaints and reviewed radiology reports and findings.  These findings were personally discussed with the patient.  Please see medical record for radiology report.  PROCEDURES:  Critical Care performed: No  Procedures   MEDICATIONS ORDERED IN ED: Medications  morphine  (PF) 4 MG/ML injection 4 mg (4 mg Intravenous Given 10/29/23 1554)  fentaNYL  (SUBLIMAZE ) injection 50 mcg (has no administration in time range)  metoCLOPramide  (REGLAN ) injection 10 mg (has no administration in time range)  ondansetron  (ZOFRAN ) injection 4 mg (4 mg Intravenous Given 10/29/23 1554)  sodium chloride  0.9 % bolus 1,000 mL (1,000 mLs Intravenous New Bag/Given 10/29/23 1555)  iohexol  (OMNIPAQUE ) 350 MG/ML injection 100 mL (100 mLs Intravenous Contrast Given 10/29/23 1617)     IMPRESSION / MDM / ASSESSMENT AND PLAN / ED COURSE  I reviewed the triage vital signs and the nursing notes.                              Differential diagnosis includes, but is not limited to, otitis, gastritis, colitis, biliary pathology, hepatitis, mesenteric  ischemia  Patient presenting to the ER for evaluation of symptoms as described above.  Based on symptoms, risk factors and considered above differential, this presenting complaint could reflect a potentially life-threatening illness therefore the patient will be placed on continuous pulse oximetry and telemetry for monitoring.  Laboratory evaluation will be sent to evaluate for the above complaints.  CT imaging the abdomen will be ordered for the blood differential order IV fluids as well as IV morphine  and Zofran  for symptom relief.   Clinical Course as of 10/29/23 1743  Wed Oct 29, 2023  1638 CTA abdomen and pelvis on my review and interpretation without evidence of acute ischemic event but there is evidence of acute pancreatitis.  Will await formal radiology report. [PR]  1742 CT formal report with evidence of acute pancreatitis.  Patient requiring multiple doses of IV narcotic pain medication remains hemodynamically stable.  Will consult hospitalist for admission. [PR]    Clinical Course User Index [PR] Lang Dover, MD     FINAL CLINICAL IMPRESSION(S) / ED DIAGNOSES   Final diagnoses:  Alcohol-induced acute pancreatitis, unspecified complication status     Rx / DC Orders   ED Discharge Orders     None        Note:  This document was prepared using Dragon voice recognition software and may include unintentional dictation errors.    Lang Dover, MD 10/29/23 [phone]
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
## 2023-10-29 NOTE — ED Notes (Signed)
 Pt requesting more pain and nausea medication. MD made aware.

## 2023-10-29 NOTE — ED Notes (Signed)
 Patient transported to CT

## 2023-10-30 ENCOUNTER — Encounter: Payer: Self-pay | Admitting: Internal Medicine

## 2023-10-30 DIAGNOSIS — E119 Type 2 diabetes mellitus without complications: Secondary | ICD-10-CM | POA: Diagnosis not present

## 2023-10-30 DIAGNOSIS — J449 Chronic obstructive pulmonary disease, unspecified: Secondary | ICD-10-CM | POA: Diagnosis not present

## 2023-10-30 DIAGNOSIS — F1010 Alcohol abuse, uncomplicated: Secondary | ICD-10-CM | POA: Diagnosis not present

## 2023-10-30 DIAGNOSIS — K852 Alcohol induced acute pancreatitis without necrosis or infection: Secondary | ICD-10-CM | POA: Diagnosis not present

## 2023-10-30 LAB — CBC
HCT: 42.8 % (ref 39.0–52.0)
Hemoglobin: 14.4 g/dL (ref 13.0–17.0)
MCH: 29.3 pg (ref 26.0–34.0)
MCHC: 33.6 g/dL (ref 30.0–36.0)
MCV: 87 fL (ref 80.0–100.0)
Platelets: 180 10*3/uL (ref 150–400)
RBC: 4.92 MIL/uL (ref 4.22–5.81)
RDW: 15.9 % — ABNORMAL HIGH (ref 11.5–15.5)
WBC: 8.4 10*3/uL (ref 4.0–10.5)
nRBC: 0 % (ref 0.0–0.2)

## 2023-10-30 LAB — BASIC METABOLIC PANEL
Anion gap: 12 (ref 5–15)
BUN: 11 mg/dL (ref 8–23)
CO2: 24 mmol/L (ref 22–32)
Calcium: 8.6 mg/dL — ABNORMAL LOW (ref 8.9–10.3)
Chloride: 104 mmol/L (ref 98–111)
Creatinine, Ser: 0.99 mg/dL (ref 0.61–1.24)
GFR, Estimated: >60 mL/min (ref >60)
Glucose, Bld: 147 mg/dL — ABNORMAL HIGH (ref 70–99)
Potassium: 4.1 mmol/L (ref 3.5–5.1)
Sodium: 140 mmol/L (ref 135–145)

## 2023-10-30 LAB — CBG MONITORING, ED
Glucose-Capillary: 115 mg/dL — ABNORMAL HIGH (ref 70–99)
Glucose-Capillary: 120 mg/dL — ABNORMAL HIGH (ref 70–99)
Glucose-Capillary: 151 mg/dL — ABNORMAL HIGH (ref 70–99)

## 2023-10-30 LAB — HEMOGLOBIN A1C
Hgb A1c MFr Bld: 6.7 % — ABNORMAL HIGH (ref 4.8–5.6)
Mean Plasma Glucose: 145.59 mg/dL

## 2023-10-30 LAB — GLUCOSE, CAPILLARY: Glucose-Capillary: 127 mg/dL — ABNORMAL HIGH (ref 70–99)

## 2023-10-30 LAB — LIPASE, BLOOD: Lipase: 316 U/L — ABNORMAL HIGH (ref 11–51)

## 2023-10-30 LAB — HIV ANTIBODY (ROUTINE TESTING W REFLEX): HIV Screen 4th Generation wRfx: NONREACTIVE

## 2023-10-30 NOTE — TOC Initial Note (Addendum)
 Transition of Care [facility]) - Initial/Assessment Note    Patient Details  Name: Vincent [name] MRN: [id number] Date of Birth: [birth year]  [facility] CM/SW Contact:    Lauraine JAYSON Carpen, LCSW Phone Number: 10/30/2023, 12:30 PM  Clinical Narrative:  CSW met with patient. No supports at bedside. CSW introduced role and inquired about interest in SA resources which he declined. CSW encouraged him to notify RN if he changes his mind. SDOH flags for food and utilities. CSW added resources to AVS. No further concerns.                 Expected Discharge Plan: Home/Self Care Barriers to Discharge: Continued Medical Work up   Patient Goals and CMS Choice            Expected Discharge Plan and Services     Post Acute Care Choice: NA Living arrangements for the past 2 months: Single Family Home                                      Prior Living Arrangements/Services Living arrangements for the past 2 months: Single Family Home   Patient language and need for interpreter reviewed:: Yes Do you feel safe going back to the place where you live?: Yes      Need for Family Participation in Patient Care: Yes (Comment)     Criminal Activity/Legal Involvement Pertinent to Current Situation/Hospitalization: No - Comment as needed  Activities of Daily Living      Permission Sought/Granted                  Emotional Assessment Appearance:: Appears stated age Attitude/Demeanor/Rapport: Engaged Affect (typically observed): Appropriate, Calm Orientation: : Oriented to Self, Oriented to Place, Oriented to  Time, Oriented to Situation Alcohol / Substance Use: Alcohol Use Psych Involvement: No (comment)  Admission diagnosis:  Acute pancreatitis [K85.90] Patient Active Problem List   Diagnosis Date Noted   Acute pancreatitis 10/29/2023   Femoral artery occlusion, left (HCC) 08/28/2022   Critical limb ischemia of left lower extremity (HCC) 08/27/2022   Type 2  diabetes mellitus with peripheral neuropathy (HCC) 08/27/2022   Dyslipidemia 08/27/2022   BPH (benign prostatic hyperplasia) 08/27/2022   Gout 08/27/2022   Peripheral arterial disease (HCC) 08/27/2022   Chest pain 03/09/2021   Atrial fibrillation (HCC) 03/09/2021   Hyponatremia 03/09/2021   Acute renal failure superimposed on stage 3a chronic kidney disease (HCC) 03/09/2021   Tobacco abuse 03/09/2021   Alcohol use 03/09/2021   Hypomagnesemia 03/09/2021   Hepatitis C antibody test positive 11/28/2015   Prediabetes 11/28/2015   PCP:  [facility] Pharm[facility]:   [facility] [address] [place] [address] Phone: [phone] Fax: [phone]  [facility] [address] ([address]), [place] - [address] [place] ([place]) [place] [address] Phone: [phone] Fax: [phone]  [facility] [address], [place] - [address] [place] [address] Phone: [phone] Fax: [phone]     Social Drivers of Health (SDOH) Social History: SDOH Screenings   Food Insecurity: Food Insecurity Present (09/23/2022)   [facility] from [facility], [facility]  Housing: Medium Risk (08/29/2022)  Transportation Needs: No Transportation Needs (03/19/2023)   Received from [facility]  Utilities: At Risk (08/29/2022)  Financial Resource Strain: High Risk (09/23/2022)  Received from [facility], [facility]  Tobacco Use: High Risk (10/29/2023)  Health Literacy: Low Risk  (03/19/2023)   Received from [facility]   SDOH Interventions:     Readmission Risk Interventions     No data to display

## 2023-10-30 NOTE — Progress Notes (Signed)
 Progress Note   Patient: Vincent [name] FMW:[id number] DOB: 12-04-1960 DOA: 10/29/2023     1 DOS: the patient was seen and examined on 10/30/2023   Brief hospital course: GALILEO COLELLO is a 63 y.o. male with medical history significant of COPD, coronary artery disease, diabetes, gout, hypertension, alcohol use disorder, bilateral above-knee amputations who presents to the emergency room with worsening abdominal pain with associated nausea and vomiting that have been going on for 1 day.    Assessment and Plan: Acute pancreatitis secondary to alcohol use Patient presented with abdominal pain, elevated lipase and CT scan finding showing acute pancreatitis We will keep n.p.o. for now since patient unable to tolerate orals at this time Continue pain regiment Gentle IV fluids Advance diet as tolerated. Symptomatic management with Zofran    COPD not in acute exacerbation DuoNebs as needed.   Coronary artery disease We will resume home medication once he tolerates diet.   Diabetes mellitus type 2 Accuchecks, sliding scale Hypoglycemia protocol.   Essential hypertension,  IV hydralazine  as needed until able to take oral meds   Pulmonary nodule Incidental finding seen on abdominal CT scan showing new nodular consolidation of the peripheral right lower lobe measuring 1.9 x 1.1 cm.  Patient has agreed to have repeat CT scan in 3 months  Alcohol abuse: Advised to quit alcohol. CIWA protocol. Thiamine ,folate and MVT once diet is started.    Out of bed to chair. Incentive spirometry. Nursing supportive care. Fall, aspiration precautions. DVT prophylaxis   Code Status: Full Code Subjective: Patient is seen and examined today morning. He is lying, seems in discomfort. States on and off squeezing abdominal pain.   Physical Exam: Vitals:   10/30/23 0800 10/30/23 0906 10/30/23 1257 10/30/23 1755  BP: (!) 145/66  (!) 140/68 138/74  Pulse: 76  70 75  Resp: 18  17 19   Temp:  98.1 F  (36.7 C) 98.2 F (36.8 C) 98.7 F (37.1 C)  TempSrc:  Oral Oral Oral  SpO2: 97%  99% 98%  Weight:      Height:        General - Middle aged African American male, in distress due to pain HEENT - PERRLA, EOMI, atraumatic head, non tender sinuses. Lung - Clear, basal rales, no rhonchi, wheezes. Heart - S1, S2 heard, no murmurs, rubs, trace pedal edema. Abdomen - Soft, epigastric tenderness, bowel sounds good Neuro - Alert, awake and oriented x 3, non focal exam. Skin - Warm and dry.  Data Reviewed:      Latest Ref Rng & Units 10/30/2023    5:13 AM 10/29/2023    6:39 PM 10/29/2023   12:09 PM  CBC  WBC 4.0 - 10.5 K/uL 8.4  8.0  7.5   Hemoglobin 13.0 - 17.0 g/dL 85.5  84.4  83.3   Hematocrit 39.0 - 52.0 % 42.8  46.0  49.4   Platelets 150 - 400 K/uL 180  196  218       Latest Ref Rng & Units 10/30/2023    5:13 AM 10/29/2023    6:39 PM 10/29/2023   12:09 PM  BMP  Glucose 70 - 99 mg/dL 852   797   BUN 8 - 23 mg/dL 11   11   Creatinine 9.38 - 1.24 mg/dL 9.00  9.02  8.98   Sodium 135 - 145 mmol/L 140   139   Potassium 3.5 - 5.1 mmol/L 4.1   4.0   Chloride 98 - 111 mmol/L 104  100   CO2 22 - 32 mmol/L 24   21   Calcium  8.9 - 10.3 mg/dL 8.6   9.8    CT Angio Abd/Pel W and/or Wo Contrast Result Date: 10/29/2023 CLINICAL DATA:  Acute abdominal pain, mesenteric ischemia * Tracking Code: BO * EXAM: CTA ABDOMEN AND PELVIS WITHOUT AND WITH CONTRAST TECHNIQUE: Multidetector CT imaging of the abdomen and pelvis was performed using the standard protocol during bolus administration of intravenous contrast. Multiplanar reconstructed images and MIPs were obtained and reviewed to evaluate the vascular anatomy. RADIATION DOSE REDUCTION: This exam was performed according to the departmental dose-optimization program which includes automated exposure control, adjustment of the mA and/or kV according to patient size and/or use of iterative reconstruction technique. CONTRAST:  OMNIPAQUE  IOHEXOL  350 MG/ML  SOLN COMPARISON:  CT angiogram abdomen pelvis runoff, 08/27/2022 FINDINGS: VASCULAR Normal caliber of the abdominal aorta. Severe mixed calcific atherosclerosis. No evidence of aneurysm, dissection, or other acute aortic pathology. 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 Atherosclerosis of the branch vessel origins in the proximal vessels, however they remain patent, with specific attention to the superior mesenteric artery and its proximal order branch vessels. Review of the MIP images confirms the above findings. NON-VASCULAR Lower Chest: New nodular consolidation of the peripheral right lower lobe measuring 1.9 x 1.1 cm (series 7, image 20). Hepatobiliary: No solid liver abnormality is seen. No gallstones, gallbladder wall thickening, or biliary dilatation. Pancreas: Diffuse inflammatory fat stranding and fluid about the pancreas, central mesentery, and adjacent retroperitoneum, particularly involving the body and tail (series 5, image 67). No discrete fluid collection. No evidence of pancreatic parenchymal necrosis on this single phase examination. Calcification within the pancreatic head. Spleen: Normal in size without significant abnormality. Adrenals/Urinary Tract: Adrenal glands are unremarkable. Kidneys are normal, without renal calculi, solid lesion, or hydronephrosis. Bladder is unremarkable. Stomach/Bowel: Stomach is within normal limits. Appendix appears normal. No evidence of bowel wall thickening, distention, or inflammatory changes. Lymphatic: No enlarged abdominal or pelvic lymph nodes. Reproductive: Prostatomegaly. Other: No abdominal wall hernia or abnormality. No ascites. Musculoskeletal: No acute osseous findings. IMPRESSION: 1. Diffuse inflammatory fat stranding and fluid about the pancreas, central mesentery, and adjacent retroperitoneum, particularly involving the body and tail. No discrete fluid  collection. No evidence of pancreatic parenchymal necrosis on this single phase examination. Findings are consistent with acute pancreatitis. 2. Normal caliber of the abdominal aorta. Severe mixed calcific atherosclerosis. No evidence of aneurysm, dissection, or other acute aortic pathology. Atherosclerosis of the branch vessels, however they remain patent, with specific attention to the superior mesenteric artery and its proximal order branch vessels. 3. New nodular consolidation of the peripheral right lower lobe measuring 1.9 x 1.1 cm. This is nonspecific, possibly infectious or inflammatory, however concerning for malignancy. Consider one of the following in 3 months for both low-risk and high-risk individuals: (a) repeat chest CT, (b) follow-up PET-CT, or (c) tissue sampling. This recommendation follows the consensus statement: Guidelines for Management of Incidental Pulmonary Nodules Detected on CT Images: From the Fleischner Society 2017; Radiology 2017; 284:228-243. 4. Prostatomegaly. Aortic Atherosclerosis (ICD10-I70.0). Electronically Signed   By: Marolyn JONETTA Jaksch M.D.   On: 10/29/2023 17:33   Family Communication: Discussed with patient, he understand and agree. All questions answereed.  Disposition: Status is: Inpatient Remains inpatient appropriate because: advance diet, pain control  Planned Discharge Destination: Home     Time spent: 36 minutes  Author: Concepcion Riser, MD  10/30/2023 7:55 PM Secure chat 7am to 7pm For on call review [email or web address].

## 2023-10-30 NOTE — Discharge Instructions (Signed)
 Food Resources  Agency Name: [facility] Agency Address: [address] [place], [place] [address] Phone: [phone] Website: [email or web address] Service(s) Offered: Housing services, self-sufficiency, congregate meal program, weatherization program, Event organiser program, emergency food assistance,  housing counseling, home ownership program, wheels - to work program.  [employer] free for 60 and older at various locations from [employer], Monday-Friday:  [employer], [address] [place], [phone] -[facility], [address], [place] [phone]  -[facility], [address], [place] [phone]  -[address], [address], [place], [phone]  Agency Name: [facility] Address: [phone] [address], [place], [place] [address] Phone: [phone] Website: [email or web address] Service(s) Offered: Home delivered hot, frozen, and emergency  meals. Grocery assistance program which matches  volunteers one-on-one with seniors unable to grocery shop  for themselves. Must be 60 years and older; less than 20  hours of in-home aide service, limited or no driving ability;  live alone or with someone with a disability; live in  [place].  Agency Name: [employer] [facility] [employer]) Address: [address] [place], [place] [address] Phone: [phone] Service(s) Offered: Food is served to shut-ins, homeless, elderly, and low income people in the community every Saturday (11:30 am-12:30 pm) and Sunday (12:30 pm-1:30pm). Volunteers also offer help and encouragement in seeking employment,  and spiritual guidance.  Agency Name: [facility] Address: [address] [place], [place] [address] Phone: [phone] Service(s) Offered: Child support services; child welfare services; food stamps; Medicaid; work first family assistance; and aid  with fuel,  rent, food and medicine.  Agency Name: [employer] Address: [address] [place], [place] Phone: [phone] Website: [email or web address] Services Offered: Monday 10:00am-12:00, 8:00pm-9:00pm, and Friday 10:00am-12:00.  Agency Name: [employer] [facility] Address: [address], [place], [place] [address] Phone: [phone] Website: [email or web address] Service(s) Offered: Serves weekday meals, open from 11:30 am- 1:00 pm., and 6:30-7:30pm, Monday-Wednesday-Friday distributes food 3:30-6pm, Monday-Wednesday-Friday.  Agency Name: [facility] Address: [address] [place], [place] Phone: [phone] Website: [email or web address] Services Offered: Distributes food the 4th Saturday of the month, starting at 8:00 am  Agency Name: [facility] Address: [phone] [address] [place], [place] [address] Phone: [phone] Website: [email or web address] Service(s) Offered: Bread of life, weekly food pantry. Open Wednesdays from 10:00am-noon.  Agency Name: [facility] [employer] Address: [address] [place], [place], [place] Phone: [phone] Services Offered: Distributes food 9am-1pm, Monday-Thursday. Call for details.  Agency Name: [facility] Address: [address], [place], [place] [address] Phone: [phone] Website: [email or web address] Service(s) Offered: Games developer. Call for assistance.  Agency Name: [facility] Address: [address] [place], [place] [address] Phone: [phone] Service Offered: Emergency Food Pantry. Call for appointment.  Agency Name: [facility] Address: [address], [place], [place] [address] Phone: [phone] Website: [email or web address] Services Offered: Games developer. Call for details  Agency Name: [facility] Address: [address] [place], [place] Phone:  [phone] Website: [email or web address] Service(s) Offered: Emergency Food Pantry. Call for details.  Agency Name: Holiday representative Address: [address] [place], [place] [address] Phone: [phone] or [phone] Website: [email or web address] Service(s) Offered: Distribute food 9am-11:30 am, Tuesday-Friday, and 1-3:30pm, Monday-Friday. Food pantry Monday-Friday 1pm-3pm, fresh items, Mon.-Wed.-Fri.  Agency Name: [facility] Address: [address] [place], [place] [address] Phone: [phone] Website: [email or web address] Services Offered: Distribute food Tues and Sats from 9:00am-noon.  Closed 1st Saturday of each month. Call for details  Agency Name: [employer] Address: [place] [facility] [facility] [address] [place] [address] Phone: [phone]  Services Offered: Delivers meals every Thursday   Agency Name: [facility] Agency Address: [address] [place], [place] [address] Phone: [phone] Website: [email or web address] Service(s) Offered: Housing services, self-sufficiency, congregate meal program, and individual development account program.  Agency Name: [employer] [facility] [employer] Address: [address] [place], [place] [address] Phone: [phone] Email: [email or web address] Website: [email or web address] Service(s) Offered: Housing the homeless, feeding the hungry, Company secretary, job and education related services.  Agency Name: [facility] Address: [address], [place], [place] [address] Phone: [phone] Email: [email or web address] Service(s) Offered: Counseling, problem pregnancy, advocacy for Hispanics, limited emergency financial assistance.  Agency Name: Department [facility] Social Services Address: [address] [place], [place] [address] Phone: [phone] Website: [email or web address] Service(s) Offered: Child  support services; child welfare services; SNAP; Medicaid; work first family assistance; and aid with fuel,  rent, food and medicine.  Agency Name: Holiday representative Address: [address], [place], [place] [address] Phone: [phone] or [phone] Email: [email or web address] Service(s) Offered: Family services and transient assistance; emergency food, fuel, clothing, limited furniture, utilities; budget counseling, general counseling; give a kid a coat; thrift store; Christmas food and toys. Utility assistance, food pantry, rental  assistance, life sustaining medicine

## 2023-10-31 DIAGNOSIS — K852 Alcohol induced acute pancreatitis without necrosis or infection: Secondary | ICD-10-CM | POA: Diagnosis not present

## 2023-10-31 DIAGNOSIS — E1142 Type 2 diabetes mellitus with diabetic polyneuropathy: Secondary | ICD-10-CM | POA: Diagnosis not present

## 2023-10-31 DIAGNOSIS — Z720 Tobacco use: Secondary | ICD-10-CM | POA: Diagnosis not present

## 2023-10-31 DIAGNOSIS — R911 Solitary pulmonary nodule: Secondary | ICD-10-CM | POA: Insufficient documentation

## 2023-10-31 DIAGNOSIS — R8290 Unspecified abnormal findings in urine: Secondary | ICD-10-CM | POA: Diagnosis not present

## 2023-10-31 LAB — GLUCOSE, CAPILLARY
Glucose-Capillary: 118 mg/dL — ABNORMAL HIGH (ref 70–99)
Glucose-Capillary: 237 mg/dL — ABNORMAL HIGH (ref 70–99)

## 2023-10-31 MED ORDER — OXYCODONE HCL 5 MG PO TABS: 5.0000 mg | ORAL_TABLET | Freq: Three times a day (TID) | ORAL | 0 refills | Status: AC | PRN

## 2023-10-31 MED ORDER — ONDANSETRON 4 MG PO TBDP: 4.0000 mg | ORAL_TABLET | Freq: Three times a day (TID) | ORAL | 0 refills | Status: DC | PRN

## 2023-10-31 NOTE — Progress Notes (Signed)
 Discharge instructions were reviewed with patient. Questions were encourage and answered. IV was removed. Belongings collected by patient. EMS picked up patient.

## 2023-10-31 NOTE — Discharge Summary (Signed)
 Physician Discharge Summary   Patient: Vincent [name] MRN: [id number] DOB: 01-07-1961  Admit date:     10/29/2023  Discharge date: 10/31/23  Discharge Physician: Concepcion Riser   PCP: [facility]   Recommendations at discharge:    PCP follow up in 1 week Advised follow up CT chest in 3 months for right lung nodule.  Discharge Diagnoses: Principal Problem:   Acute pancreatitis Active Problems:   Type 2 diabetes mellitus with peripheral neuropathy (HCC)   Tobacco abuse   Pulmonary nodule 1 cm or greater in diameter   Abnormal urinalysis  Resolved Problems:   * No resolved hospital problems. *  Hospital Course: Vincent [name] is a 63 y.o. male with medical history significant of COPD, coronary artery disease, diabetes, gout, hypertension, alcohol use disorder, bilateral above-knee amputations who presents to the emergency room with worsening abdominal pain with associated nausea and vomiting that have been going on for 1 day.    Assessment and Plan: Acute pancreatitis secondary to alcohol use Patient presented with abdominal pain, elevated lipase and CT scan finding showing acute pancreatitis.  Patient is placed n.p.o., started on IV hydration. Patient's pain better controlled, continued on IV antiemetics Gradually his diet is advanced, able to tolerate soft diet. Pain better controlled, nausea improved. Advised him to continue bland diet for next few days before starting regular diet. Zofran  ODT, oxycodone  sent to pharmacy.   COPD not in acute exacerbation DuoNebs as needed. Advised to stop smoking.   Coronary artery disease Patient is advised to continue aspirin , statin therapy.   Diabetes mellitus type 2 Carb consistent diet advised. Advised to follow-up with PCP.   History of paroxysmal A-fib Advised to continue follow-up with cardiology, continue taking Eliquis .   Pulmonary nodule Incidental finding seen on abdominal CT scan  showing new nodular consolidation of the peripheral right lower lobe measuring 1.9 x 1.1 cm.  Patient has agreed to have repeat CT scan in 3 months   Alcohol abuse: No withdrawal symptoms. Counseled to quit alcohol. Discussed the current diagnosis is related to his alcohol use.  Abnormal UA: Patient does not have urinary symptoms. He got IV hydration, encourage oral fluids.        Consultants: none Procedures performed: none  Disposition: Home Diet recommendation:  Discharge Diet Orders (From admission, onward)     Start     Ordered   10/31/23 0000  Diet - low sodium heart healthy        10/31/23 1242   10/31/23 0000  Diet Carb Modified        10/31/23 1242           Cardiac diet DISCHARGE MEDICATION: Allergies as of 10/31/2023   No Known Allergies      Medication List     STOP taking these medications    sulfamethoxazole -trimethoprim  800-160 MG tablet Commonly known as: BACTRIM  DS       TAKE these medications    aspirin  EC 81 MG tablet Take 1 tablet (81 mg total) by mouth daily. Swallow whole.   atorvastatin  40 MG tablet Commonly known as: Lipitor Take 1 tablet (40 mg total) by mouth daily.   Eliquis  5 MG Tabs tablet Generic drug: apixaban  Take 1 tablet (5 mg total) by mouth 2 (two) times daily.   gabapentin  300 MG capsule Commonly known as: Neurontin  Take 1 capsule (300 mg total) by mouth 3 (three) times daily.   ondansetron  4 MG disintegrating tablet Commonly known as: ZOFRAN -ODT  Take 1 tablet (4 mg total) by mouth every 8 (eight) hours as needed for nausea or vomiting.   oxyCODONE  5 MG immediate release tablet Commonly known as: Roxicodone  Take 1 tablet (5 mg total) by mouth every 8 (eight) hours as needed for up to 3 days.        Discharge Exam: Filed Weights   10/29/23 1205  Weight: 68 kg   General - Middle aged African American male, in distress due to pain HEENT - PERRLA, EOMI, atraumatic head, non tender sinuses. Lung -  Clear, basal rales, no rhonchi, wheezes. Heart - S1, S2 heard, no murmurs, rubs, trace pedal edema. Abdomen - Soft, epigastric tenderness improved, bowel sounds good Neuro - Alert, awake and oriented x 3, non focal exam. Skin - Warm and dry.  Condition at discharge: stable  The results of significant diagnostics from this hospitalization (including imaging, microbiology, ancillary and laboratory) are listed below for reference.   Imaging Studies: CT Angio Abd/Pel W and/or Wo Contrast Result Date: 10/29/2023 CLINICAL DATA:  Acute abdominal pain, mesenteric ischemia * Tracking Code: BO * EXAM: CTA ABDOMEN AND PELVIS WITHOUT AND WITH CONTRAST TECHNIQUE: Multidetector CT imaging of the abdomen and pelvis was performed using the standard protocol during bolus administration of intravenous contrast. Multiplanar reconstructed images and MIPs were obtained and reviewed to evaluate the vascular anatomy. RADIATION DOSE REDUCTION: This exam was performed according to the departmental dose-optimization program which includes automated exposure control, adjustment of the mA and/or kV according to patient size and/or use of iterative reconstruction technique. CONTRAST:  OMNIPAQUE  IOHEXOL  350 MG/ML SOLN COMPARISON:  CT angiogram abdomen pelvis runoff, 08/27/2022 FINDINGS: VASCULAR Normal caliber of the abdominal aorta. Severe mixed calcific atherosclerosis. No evidence of aneurysm, dissection, or other acute aortic pathology. 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 Atherosclerosis of the branch vessel origins in the proximal vessels, however they remain patent, with specific attention to the superior mesenteric artery and its proximal order branch vessels. Review of the MIP images confirms the above findings. NON-VASCULAR Lower Chest: New nodular consolidation of the peripheral right lower lobe measuring 1.9 x 1.1 cm (series  7, image 20). Hepatobiliary: No solid liver abnormality is seen. No gallstones, gallbladder wall thickening, or biliary dilatation. Pancreas: Diffuse inflammatory fat stranding and fluid about the pancreas, central mesentery, and adjacent retroperitoneum, particularly involving the body and tail (series 5, image 67). No discrete fluid collection. No evidence of pancreatic parenchymal necrosis on this single phase examination. Calcification within the pancreatic head. Spleen: Normal in size without significant abnormality. Adrenals/Urinary Tract: Adrenal glands are unremarkable. Kidneys are normal, without renal calculi, solid lesion, or hydronephrosis. Bladder is unremarkable. Stomach/Bowel: Stomach is within normal limits. Appendix appears normal. No evidence of bowel wall thickening, distention, or inflammatory changes. Lymphatic: No enlarged abdominal or pelvic lymph nodes. Reproductive: Prostatomegaly. Other: No abdominal wall hernia or abnormality. No ascites. Musculoskeletal: No acute osseous findings. IMPRESSION: 1. Diffuse inflammatory fat stranding and fluid about the pancreas, central mesentery, and adjacent retroperitoneum, particularly involving the body and tail. No discrete fluid collection. No evidence of pancreatic parenchymal necrosis on this single phase examination. Findings are consistent with acute pancreatitis. 2. Normal caliber of the abdominal aorta. Severe mixed calcific atherosclerosis. No evidence of aneurysm, dissection, or other acute aortic pathology. Atherosclerosis of the branch vessels, however they remain patent, with specific attention to the superior mesenteric artery and its proximal order branch vessels. 3. New  nodular consolidation of the peripheral right lower lobe measuring 1.9 x 1.1 cm. This is nonspecific, possibly infectious or inflammatory, however concerning for malignancy. Consider one of the following in 3 months for both low-risk and high-risk individuals: (a) repeat  chest CT, (b) follow-up PET-CT, or (c) tissue sampling. This recommendation follows the consensus statement: Guidelines for Management of Incidental Pulmonary Nodules Detected on CT Images: From the Fleischner Society 2017; Radiology 2017; 284:228-243. 4. Prostatomegaly. Aortic Atherosclerosis (ICD10-I70.0). Electronically Signed   By: Marolyn JONETTA Jaksch M.D.   On: 10/29/2023 17:33    Microbiology: Results for orders placed or performed during the hospital encounter of 08/27/22  MRSA Next Gen by PCR, Nasal     Status: None   Collection Time: 08/28/22  6:45 PM   Specimen: Nasal Mucosa; Nasal Swab  Result Value Ref Range Status   MRSA by PCR Next Gen NOT DETECTED NOT DETECTED Final    Comment: (NOTE) The GeneXpert MRSA Assay (FDA approved for NASAL specimens only), is one component of a comprehensive MRSA colonization surveillance program. It is not intended to diagnose MRSA infection nor to guide or monitor treatment for MRSA infections. Test performance is not FDA approved in patients less than 23 years old. Performed at [facility], [address] [place], [place] [address]     Labs: CBC: Recent Labs  Lab 10/29/23 1209 10/29/23 1839 10/30/23 0513  WBC 7.5 8.0 8.4  HGB 16.6 15.5 14.4  HCT 49.4 46.0 42.8  MCV 85.5 86.8 87.0  PLT 218 196 180   Basic Metabolic Panel: Recent Labs  Lab 10/29/23 1209 10/29/23 1839 10/30/23 0513  NA 139  --  140  K 4.0  --  4.1  CL 100  --  104  CO2 21*  --  24  GLUCOSE 202*  --  147*  BUN 11  --  11  CREATININE 1.01 0.97 0.99  CALCIUM  9.8  --  8.6*   Liver Function Tests: Recent Labs  Lab 10/29/23 1209  AST 29  ALT 23  ALKPHOS 91  BILITOT 1.3*  PROT 7.9  ALBUMIN  3.8   CBG: Recent Labs  Lab 10/30/23 0733 10/30/23 1210 10/30/23 2040 10/31/23 0800 10/31/23 1141  GLUCAP 151* 115* 127* 118* 237*    Discharge time spent: 32 minutes.  Signed: Concepcion Riser, MD [facility] 10/31/2023

## 2023-10-31 NOTE — Plan of Care (Signed)

## 2023-10-31 NOTE — Plan of Care (Signed)

## 2024-03-05 ENCOUNTER — Other Ambulatory Visit: Payer: Self-pay | Admitting: Family Medicine

## 2024-03-05 DIAGNOSIS — R911 Solitary pulmonary nodule: Secondary | ICD-10-CM

## 2024-09-10 ENCOUNTER — Other Ambulatory Visit: Payer: Self-pay

## 2024-09-10 ENCOUNTER — Inpatient Hospital Stay
Admission: EM | Admit: 2024-09-10 | Discharge: 2024-09-15 | DRG: 439 | Disposition: A | Discharge status: Home / Self Care | Attending: Family Medicine | Admitting: Family Medicine

## 2024-09-10 ENCOUNTER — Inpatient Hospital Stay

## 2024-09-10 DIAGNOSIS — N390 Urinary tract infection, site not specified: Secondary | ICD-10-CM | POA: Diagnosis present

## 2024-09-10 DIAGNOSIS — Z89612 Acquired absence of left leg above knee: Secondary | ICD-10-CM

## 2024-09-10 DIAGNOSIS — I129 Hypertensive chronic kidney disease with stage 1 through stage 4 chronic kidney disease, or unspecified chronic kidney disease: Secondary | ICD-10-CM | POA: Diagnosis present

## 2024-09-10 DIAGNOSIS — F1090 Alcohol use, unspecified, uncomplicated: Secondary | ICD-10-CM | POA: Diagnosis present

## 2024-09-10 DIAGNOSIS — I4891 Unspecified atrial fibrillation: Secondary | ICD-10-CM | POA: Diagnosis present

## 2024-09-10 DIAGNOSIS — K859 Acute pancreatitis without necrosis or infection, unspecified: Principal | ICD-10-CM | POA: Diagnosis present

## 2024-09-10 DIAGNOSIS — M109 Gout, unspecified: Secondary | ICD-10-CM | POA: Diagnosis present

## 2024-09-10 DIAGNOSIS — F1010 Alcohol abuse, uncomplicated: Secondary | ICD-10-CM | POA: Diagnosis present

## 2024-09-10 DIAGNOSIS — K852 Alcohol induced acute pancreatitis without necrosis or infection: Secondary | ICD-10-CM

## 2024-09-10 DIAGNOSIS — Z7901 Long term (current) use of anticoagulants: Secondary | ICD-10-CM

## 2024-09-10 DIAGNOSIS — E1151 Type 2 diabetes mellitus with diabetic peripheral angiopathy without gangrene: Secondary | ICD-10-CM | POA: Diagnosis present

## 2024-09-10 DIAGNOSIS — E871 Hypo-osmolality and hyponatremia: Secondary | ICD-10-CM | POA: Diagnosis present

## 2024-09-10 DIAGNOSIS — I480 Paroxysmal atrial fibrillation: Secondary | ICD-10-CM | POA: Diagnosis present

## 2024-09-10 DIAGNOSIS — Z89611 Acquired absence of right leg above knee: Secondary | ICD-10-CM

## 2024-09-10 DIAGNOSIS — I2510 Atherosclerotic heart disease of native coronary artery without angina pectoris: Secondary | ICD-10-CM | POA: Diagnosis present

## 2024-09-10 DIAGNOSIS — Z6841 Body Mass Index (BMI) 40.0 and over, adult: Secondary | ICD-10-CM

## 2024-09-10 DIAGNOSIS — E1142 Type 2 diabetes mellitus with diabetic polyneuropathy: Secondary | ICD-10-CM | POA: Diagnosis present

## 2024-09-10 DIAGNOSIS — K861 Other chronic pancreatitis: Secondary | ICD-10-CM | POA: Diagnosis present

## 2024-09-10 DIAGNOSIS — E785 Hyperlipidemia, unspecified: Secondary | ICD-10-CM | POA: Diagnosis not present

## 2024-09-10 DIAGNOSIS — F17210 Nicotine dependence, cigarettes, uncomplicated: Secondary | ICD-10-CM | POA: Diagnosis present

## 2024-09-10 DIAGNOSIS — I739 Peripheral vascular disease, unspecified: Secondary | ICD-10-CM | POA: Diagnosis present

## 2024-09-10 DIAGNOSIS — K853 Drug induced acute pancreatitis without necrosis or infection: Secondary | ICD-10-CM | POA: Diagnosis not present

## 2024-09-10 DIAGNOSIS — E860 Dehydration: Secondary | ICD-10-CM | POA: Diagnosis present

## 2024-09-10 DIAGNOSIS — J449 Chronic obstructive pulmonary disease, unspecified: Secondary | ICD-10-CM | POA: Diagnosis present

## 2024-09-10 DIAGNOSIS — E1122 Type 2 diabetes mellitus with diabetic chronic kidney disease: Secondary | ICD-10-CM | POA: Diagnosis present

## 2024-09-10 DIAGNOSIS — Z833 Family history of diabetes mellitus: Secondary | ICD-10-CM

## 2024-09-10 DIAGNOSIS — Z8619 Personal history of other infectious and parasitic diseases: Secondary | ICD-10-CM

## 2024-09-10 DIAGNOSIS — Z720 Tobacco use: Secondary | ICD-10-CM | POA: Diagnosis present

## 2024-09-10 LAB — LIPID PANEL
Cholesterol: 93 mg/dL (ref 0–200)
HDL: 29 mg/dL — ABNORMAL LOW (ref >40)
LDL Cholesterol: 43 mg/dL (ref 0–99)
Total CHOL/HDL Ratio: 3.2 ratio
Triglycerides: 105 mg/dL (ref <150)
VLDL: 21 mg/dL (ref 0–40)

## 2024-09-10 LAB — CBC
HCT: 41 % (ref 39.0–52.0)
HCT: 45.1 % (ref 39.0–52.0)
Hemoglobin: 14.2 g/dL (ref 13.0–17.0)
Hemoglobin: 15.1 g/dL (ref 13.0–17.0)
MCH: 28.9 pg (ref 26.0–34.0)
MCH: 29.2 pg (ref 26.0–34.0)
MCHC: 33.5 g/dL (ref 30.0–36.0)
MCHC: 34.6 g/dL (ref 30.0–36.0)
MCV: 84.2 fL (ref 80.0–100.0)
MCV: 86.4 fL (ref 80.0–100.0)
Platelets: 185 K/uL (ref 150–400)
Platelets: 206 K/uL (ref 150–400)
RBC: 4.87 MIL/uL (ref 4.22–5.81)
RBC: 5.22 MIL/uL (ref 4.22–5.81)
RDW: 13.2 % (ref 11.5–15.5)
RDW: 13.5 % (ref 11.5–15.5)
WBC: 8.5 K/uL (ref 4.0–10.5)
WBC: 8.6 K/uL (ref 4.0–10.5)
nRBC: 0 % (ref 0.0–0.2)
nRBC: 0 % (ref 0.0–0.2)

## 2024-09-10 LAB — COMPREHENSIVE METABOLIC PANEL WITH GFR
ALT: 23 U/L (ref 0–44)
AST: 19 U/L (ref 15–41)
Albumin: 3.4 g/dL — ABNORMAL LOW (ref 3.5–5.0)
Alkaline Phosphatase: 105 U/L (ref 38–126)
Anion gap: 14 (ref 5–15)
BUN: 8 mg/dL (ref 8–23)
CO2: 20 mmol/L — ABNORMAL LOW (ref 22–32)
Calcium: 8.8 mg/dL — ABNORMAL LOW (ref 8.9–10.3)
Chloride: 89 mmol/L — ABNORMAL LOW (ref 98–111)
Creatinine, Ser: 0.88 mg/dL (ref 0.61–1.24)
GFR, Estimated: >60 mL/min (ref >60)
Glucose, Bld: 228 mg/dL — ABNORMAL HIGH (ref 70–99)
Potassium: 4.2 mmol/L (ref 3.5–5.1)
Sodium: 123 mmol/L — ABNORMAL LOW (ref 135–145)
Total Bilirubin: 0.5 mg/dL (ref 0.0–1.2)
Total Protein: 7.5 g/dL (ref 6.5–8.1)

## 2024-09-10 LAB — URINALYSIS, ROUTINE W REFLEX MICROSCOPIC
Bilirubin Urine: NEGATIVE
Glucose, UA: 150 mg/dL — AB
Hgb urine dipstick: NEGATIVE
Ketones, ur: 5 mg/dL — AB
Nitrite: POSITIVE — AB
Protein, ur: NEGATIVE mg/dL
Specific Gravity, Urine: 1.009 (ref 1.005–1.030)
pH: 5 (ref 5.0–8.0)

## 2024-09-10 LAB — LIPASE, BLOOD: Lipase: 708 U/L — ABNORMAL HIGH (ref 11–51)

## 2024-09-10 LAB — CBG MONITORING, ED: Glucose-Capillary: 252 mg/dL — ABNORMAL HIGH (ref 70–99)

## 2024-09-10 MED ORDER — ONDANSETRON HCL 4 MG/2ML IJ SOLN
4.0000 mg | Freq: Four times a day (QID) | INTRAMUSCULAR | Status: DC | PRN
  Administered 2024-09-10: 4 mg via INTRAVENOUS
  Filled 2024-09-10: qty 2

## 2024-09-10 MED ORDER — PANTOPRAZOLE SODIUM 40 MG IV SOLR
40.0000 mg | Freq: Once | INTRAVENOUS | Status: AC
  Administered 2024-09-10: 40 mg via INTRAVENOUS
  Filled 2024-09-10: qty 10

## 2024-09-10 MED ORDER — HYDROMORPHONE HCL 1 MG/ML IJ SOLN
1.0000 mg | INTRAMUSCULAR | Status: DC | PRN
  Administered 2024-09-10 – 2024-09-11 (×5): 1 mg via INTRAVENOUS
  Filled 2024-09-10 (×5): qty 1

## 2024-09-10 MED ORDER — SODIUM CHLORIDE 0.9 % IV SOLN
1.0000 g | INTRAVENOUS | Status: DC
  Administered 2024-09-10: 1 g via INTRAVENOUS
  Filled 2024-09-10: qty 10

## 2024-09-10 MED ORDER — ENOXAPARIN SODIUM 40 MG/0.4ML IJ SOSY
0.5000 mg/kg | PREFILLED_SYRINGE | INTRAMUSCULAR | Status: DC
  Administered 2024-09-11 – 2024-09-15 (×5): 35 mg via SUBCUTANEOUS
  Filled 2024-09-10 (×5): qty 0.4

## 2024-09-10 MED ORDER — METOPROLOL TARTRATE 5 MG/5ML IV SOLN: 5.0000 mg | Freq: Four times a day (QID) | INTRAVENOUS | Status: DC | PRN

## 2024-09-10 MED ORDER — SODIUM CHLORIDE 0.9 % IV BOLUS
1000.0000 mL | Freq: Once | INTRAVENOUS | Status: AC
  Administered 2024-09-10: 1000 mL via INTRAVENOUS

## 2024-09-10 MED ORDER — SODIUM CHLORIDE 0.9 % IV SOLN: INTRAVENOUS | Status: DC

## 2024-09-10 MED ORDER — HYDROMORPHONE HCL 1 MG/ML IJ SOLN
1.0000 mg | Freq: Once | INTRAMUSCULAR | Status: AC
  Administered 2024-09-10: 1 mg via INTRAVENOUS
  Filled 2024-09-10: qty 1

## 2024-09-10 MED ORDER — IOHEXOL 300 MG/ML  SOLN
100.0000 mL | Freq: Once | INTRAMUSCULAR | Status: AC | PRN
  Administered 2024-09-10: 100 mL via INTRAVENOUS
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
## 2024-09-10 NOTE — ED Triage Notes (Signed)
 Pt presents for generalized abdominal pain. Denies fevers, chills, nausea, vomiting, diarrhea or constipation. Non-radiating. States this feels like his previous pancreatitis flares.   Past Medical History:  Diagnosis Date   Chronic kidney disease 07/27/2015   Noted at 07/27/15 [facility] visit as Stage 2   COPD (chronic obstructive pulmonary disease) (HCC)    Coronary artery disease    Diabetes mellitus without complication (HCC)    Gout    Hepatitis 11/28/2015   positive for Hep C virus antibody   Hypertension
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
## 2024-09-11 DIAGNOSIS — K852 Alcohol induced acute pancreatitis without necrosis or infection: Secondary | ICD-10-CM | POA: Diagnosis not present

## 2024-09-11 LAB — COMPREHENSIVE METABOLIC PANEL WITH GFR
ALT: 16 U/L (ref 0–44)
AST: 15 U/L (ref 15–41)
Albumin: 3.1 g/dL — ABNORMAL LOW (ref 3.5–5.0)
Alkaline Phosphatase: 88 U/L (ref 38–126)
Anion gap: 8 (ref 5–15)
BUN: 9 mg/dL (ref 8–23)
CO2: 22 mmol/L (ref 22–32)
Calcium: 8.3 mg/dL — ABNORMAL LOW (ref 8.9–10.3)
Chloride: 100 mmol/L (ref 98–111)
Creatinine, Ser: 0.81 mg/dL (ref 0.61–1.24)
GFR, Estimated: >60 mL/min (ref >60)
Glucose, Bld: 134 mg/dL — ABNORMAL HIGH (ref 70–99)
Potassium: 4.6 mmol/L (ref 3.5–5.1)
Sodium: 130 mmol/L — ABNORMAL LOW (ref 135–145)
Total Bilirubin: 0.5 mg/dL (ref 0.0–1.2)
Total Protein: 6.6 g/dL (ref 6.5–8.1)

## 2024-09-11 LAB — ETHANOL: Alcohol, Ethyl (B): <15 mg/dL (ref <15)

## 2024-09-11 LAB — CBC
HCT: 39.3 % (ref 39.0–52.0)
Hemoglobin: 13.2 g/dL (ref 13.0–17.0)
MCH: 28.8 pg (ref 26.0–34.0)
MCHC: 33.6 g/dL (ref 30.0–36.0)
MCV: 85.6 fL (ref 80.0–100.0)
Platelets: 179 K/uL (ref 150–400)
RBC: 4.59 MIL/uL (ref 4.22–5.81)
RDW: 13.3 % (ref 11.5–15.5)
WBC: 6.4 K/uL (ref 4.0–10.5)
nRBC: 0 % (ref 0.0–0.2)

## 2024-09-11 LAB — CREATININE, SERUM
Creatinine, Ser: 0.81 mg/dL (ref 0.61–1.24)
GFR, Estimated: >60 mL/min (ref >60)

## 2024-09-11 MED ORDER — HYDROCODONE-ACETAMINOPHEN 5-325 MG PO TABS
1.0000 | ORAL_TABLET | ORAL | Status: DC | PRN
  Administered 2024-09-11: 1 via ORAL
  Administered 2024-09-11 – 2024-09-14 (×11): 2 via ORAL
  Administered 2024-09-15: 1 via ORAL
  Administered 2024-09-15: 2 via ORAL
  Filled 2024-09-11 (×2): qty 2
  Filled 2024-09-11: qty 1
  Filled 2024-09-11 (×2): qty 2
  Filled 2024-09-11: qty 1
  Filled 2024-09-11: qty 2
  Filled 2024-09-11: qty 1
  Filled 2024-09-11: qty 2
  Filled 2024-09-11: qty 1
  Filled 2024-09-11 (×5): qty 2

## 2024-09-11 MED ORDER — SODIUM CHLORIDE 0.9 % IV SOLN: INTRAVENOUS | Status: AC

## 2024-09-11 MED ORDER — MORPHINE SULFATE (PF) 2 MG/ML IV SOLN
2.0000 mg | INTRAVENOUS | Status: DC | PRN
  Administered 2024-09-11 – 2024-09-12 (×4): 2 mg via INTRAVENOUS
  Filled 2024-09-11 (×4): qty 1

## 2024-09-11 NOTE — Progress Notes (Signed)
  PROGRESS NOTE    Vincent [name]  FMW:[id number] DOB: August 20, 1961 DOA: 09/10/2024 PCP: [facility]  121A/121A-BB  LOS: 1 day   Brief hospital course:   Assessment & Plan: Vincent [name] is a 63 y.o. male with medical history significant of type 2 diabetes, chronic kidney disease stage III, COPD, gout, essential hypertension, coronary artery disease, paroxysmal atrial fibrillation, history of hepatitis C, alcohol abuse history who presented to the ER with abdominal pain.  Pain is generalized.  No fever or chills some nausea but no vomiting no diarrhea no bright red blood per rectum.  Patient denies heavy alcohol use but has had drinks lately.    #1 acute on chronic pancreatitis: Suspected due to alcoholism.   --cont MIVF --clear liquid diet   #3 essential hypertension:  --BP wnl   #4 history of alcohol abuse:  Monitor for possible withdrawal.     #5 paroxysmal atrial fibrillation: Will have him.  Beta-blockers as needed.  No anticoagulation noted.   #6 peripheral vascular disease:  Status post left AKA.     #7 hyponatremia:  --improved with IVF --cont MIVF   #8 chronic kidney disease stage IIIa, ruled out   #9 UTI, ruled out --hold abx   DVT prophylaxis: Lovenox  SQ Code Status: Full code  Family Communication:  Level of care: Telemetry Dispo:   The patient is from: home Anticipated d/c is to: home Anticipated d/c date is: 1-2 days   Subjective and Interval History:  Still had abdominal pain.   Objective: Vitals:   09/11/24 0446 09/11/24 0800 09/11/24 1234 09/11/24 1653  BP: 110/64 104/64 107/67 128/74  Pulse: 85 75 70 71  Resp: 18 16 16 16   Temp: 97.9 F (36.6 C) 98.4 F (36.9 C) 98 F (36.7 C) 98.5 F (36.9 C)  TempSrc:  Oral Oral Oral  SpO2: 98% 100% 99% 99%  Weight:      Height:        Intake/Output Summary (Last 24 hours) at 09/11/2024 1923 Last data filed at 09/11/2024 1134 Gross per 24 hour  Intake --  Output  550 ml  Net -550 ml   Filed Weights   09/10/24 2341  Weight: 68.6 kg    Examination:   Constitutional: NAD, AAOx3 HEENT: conjunctivae and lids normal, EOMI CV: No cyanosis.   RESP: normal respiratory effort, on RA Neuro: II - XII grossly intact.   Psych: Normal mood and affect.  Appropriate judgement and reason   Data Reviewed: I have personally reviewed labs and imaging studies  Time spent: 35 minutes  Ellouise Haber, MD [facility] If 7PM-7AM, please contact night-coverage 09/11/2024, 7:23 PM

## 2024-09-11 NOTE — Progress Notes (Signed)
 Anticoagulation monitoring(Lovenox ):  63 yo male ordered Lovenox  40 mg Q24h    Filed Weights   09/10/24 2341  Weight: 68.6 kg (151 lb 3.8 oz)   BMI 40.9   Lab Results  Component Value Date   CREATININE 0.88 09/10/2024   CREATININE 0.99 10/30/2023   CREATININE 0.97 10/29/2023   Estimated Creatinine Clearance: 55.4 mL/min (by C-G formula based on SCr of 0.88 mg/dL). Hemoglobin & Hematocrit     Component Value Date/Time   HGB 14.2 09/10/2024 2340   HGB 16.7 09/24/2014 1034   HCT 41.0 09/10/2024 2340   HCT 49.4 09/24/2014 1034     Per Protocol for Patient with estCrcl > 30 ml/min and BMI > 30, will transition to Lovenox  35 mg Q24h.

## 2024-09-11 NOTE — Plan of Care (Signed)

## 2024-09-12 DIAGNOSIS — K852 Alcohol induced acute pancreatitis without necrosis or infection: Secondary | ICD-10-CM | POA: Diagnosis not present

## 2024-09-12 LAB — BASIC METABOLIC PANEL WITH GFR
Anion gap: 8 (ref 5–15)
BUN: 7 mg/dL — ABNORMAL LOW (ref 8–23)
CO2: 20 mmol/L — ABNORMAL LOW (ref 22–32)
Calcium: 8.2 mg/dL — ABNORMAL LOW (ref 8.9–10.3)
Chloride: 106 mmol/L (ref 98–111)
Creatinine, Ser: 0.71 mg/dL (ref 0.61–1.24)
GFR, Estimated: >60 mL/min (ref >60)
Glucose, Bld: 111 mg/dL — ABNORMAL HIGH (ref 70–99)
Potassium: 4.2 mmol/L (ref 3.5–5.1)
Sodium: 134 mmol/L — ABNORMAL LOW (ref 135–145)

## 2024-09-12 LAB — LIPASE, BLOOD: Lipase: 220 U/L — ABNORMAL HIGH (ref 11–51)

## 2024-09-12 LAB — MAGNESIUM: Magnesium: 1.3 mg/dL — ABNORMAL LOW (ref 1.7–2.4)

## 2024-09-12 LAB — PHOSPHORUS: Phosphorus: 2.9 mg/dL (ref 2.5–4.6)

## 2024-09-12 MED ORDER — MAGNESIUM SULFATE 4 GM/100ML IV SOLN
4.0000 g | Freq: Once | INTRAVENOUS | Status: AC
  Administered 2024-09-12: 4 g via INTRAVENOUS
  Filled 2024-09-12: qty 100

## 2024-09-12 NOTE — Plan of Care (Signed)

## 2024-09-12 NOTE — Progress Notes (Addendum)
 1

## 2024-09-12 NOTE — Progress Notes (Signed)
  PROGRESS NOTE    Vincent [name]  FMW:[id number] DOB: Apr 14, 1961 DOA: 09/10/2024 [facility]CP: [facility]  121A/121A-BB  LOS: 2 days   Brief hospital course:   Assessment & Plan: Vincent [name] is a 63 y.o. male with medical history significant of type 2 diabetes, chronic kidney disease stage III, COPD, gout, essential hypertension, coronary artery disease, paroxysmal atrial fibrillation, history of hepatitis C, alcohol abuse history who presented to the ER with abdominal pain.  Pain is generalized.  No fever or chills some nausea but no vomiting no diarrhea no bright red blood per rectum.  Patient denies heavy alcohol use but has had drinks lately.    #1 acute on chronic pancreatitis: Suspected due to alcoholism.   --cont MIVF --advance to soft diet   #3 essential hypertension:  --BP wnl   #4 history of alcohol abuse:  Monitor for possible withdrawal.     #5 paroxysmal atrial fibrillation:  --rate controlled   #6 peripheral vascular disease:  Status post left AKA.     #7 hyponatremia:  --improved with IVF --cont MIVF   #8 chronic kidney disease stage IIIa, ruled out   #9 UTI, ruled out --hold abx   DVT prophylaxis: Lovenox  SQ Code Status: Full code  Family Communication:  Level of care: Telemetry Dispo:   The patient is from: home Anticipated d/c is to: home Anticipated d/c date is: 1-2 days   Subjective and Interval History:  Pt reported abdominal pain improved.  Tolerating clear liquid diet.  Ready to advance.   Objective: Vitals:   09/11/24 2100 09/12/24 0342 09/12/24 0808 09/12/24 1557  BP: 118/84 121/68 137/75 126/69  Pulse: 76 62 64 66  Resp: 18 16 14 14   Temp: 98.2 F (36.8 C) 98 F (36.7 C) 98.1 F (36.7 C) 98.4 F (36.9 C)  TempSrc: Oral Oral Oral Oral  SpO2: 94% 100% 99% 100%  Weight:      Height:        Intake/Output Summary (Last 24 hours) at 09/12/2024 1750 Last data filed at 09/12/2024 1517 Gross per 24 hour   Intake 4527.22 ml  Output 1600 ml  Net 2927.22 ml   Filed Weights   09/10/24 2341  Weight: 68.6 kg    Examination:   Constitutional: NAD, AAOx3 HEENT: conjunctivae and lids normal, EOMI CV: No cyanosis.   RESP: normal respiratory effort, on RA Neuro: II - XII grossly intact.   Psych: Normal mood and affect.  Appropriate judgement and reason   Data Reviewed: I have personally reviewed labs and imaging studies  Time spent: 35 minutes  Ellouise Haber, MD [facility] If 7PM-7AM, please contact night-coverage 09/12/2024, 5:50 PM

## 2024-09-12 NOTE — Plan of Care (Signed)
  Problem: Education: Goal: Knowledge of General Education information will improve Description: Including pain rating scale, medication(s)/side effects and non-pharmacologic comfort measures Outcome: Progressing   Problem: Health Behavior/Discharge Planning: Goal: Ability to manage health-related needs will improve Outcome: Progressing   Problem: Activity: Goal: Risk for activity intolerance will decrease Outcome: Progressing   Problem: Nutrition: Goal: Adequate nutrition will be maintained Outcome: Progressing   Problem: Coping: Goal: Level of anxiety will decrease Outcome: Progressing   Problem: Elimination: Goal: Will not experience complications related to bowel motility Outcome: Progressing   Problem: Pain Managment: Goal: General experience of comfort will improve and/or be controlled Outcome: Progressing   Problem: Safety: Goal: Ability to remain free from injury will improve Outcome: Progressing

## 2024-09-13 DIAGNOSIS — K852 Alcohol induced acute pancreatitis without necrosis or infection: Secondary | ICD-10-CM | POA: Diagnosis not present

## 2024-09-13 LAB — MAGNESIUM: Magnesium: 1.5 mg/dL — ABNORMAL LOW (ref 1.7–2.4)

## 2024-09-13 MED ORDER — MAGNESIUM SULFATE 4 GM/100ML IV SOLN
4.0000 g | Freq: Once | INTRAVENOUS | Status: AC
  Administered 2024-09-13: 4 g via INTRAVENOUS
  Filled 2024-09-13: qty 100

## 2024-09-13 MED ORDER — MORPHINE SULFATE (PF) 4 MG/ML IV SOLN
4.0000 mg | INTRAVENOUS | Status: AC | PRN
  Administered 2024-09-14 (×2): 4 mg via INTRAVENOUS
  Filled 2024-09-13 (×2): qty 1

## 2024-09-13 MED ORDER — SODIUM CHLORIDE 0.9 % IV SOLN: INTRAVENOUS | Status: AC

## 2024-09-13 NOTE — Progress Notes (Signed)
  PROGRESS NOTE    Vincent [name]  FMW:[id number] DOB: 02-12-1961 DOA: 09/10/2024 [facility]CP: [facility]  121A/121A-BB  LOS: 3 days   Brief hospital course:   Assessment & Plan: Vincent [name] is a 63 y.o. male with medical history significant of type 2 diabetes, chronic kidney disease stage III, COPD, gout, essential hypertension, coronary artery disease, paroxysmal atrial fibrillation, history of hepatitis C, alcohol abuse history who presented to the ER with abdominal pain.  Pain is generalized.  No fever or chills some nausea but no vomiting no diarrhea no bright red blood per rectum.  Patient denies heavy alcohol use but has had drinks lately.    #1 acute on chronic pancreatitis: Suspected due to alcoholism.   --cont MIVF --due to more pain, downgrade diet back down to clear liquid   #3 essential hypertension:  --BP wnl   #4 history of alcohol abuse:  Monitor for possible withdrawal.     #5 paroxysmal atrial fibrillation:  --rate controlled   #6 peripheral vascular disease:  Status post left AKA.     #7 hyponatremia:  --improved with IVF --cont MIVF   #8 chronic kidney disease stage IIIa, ruled out   #9 UTI, ruled out --hold abx  Hypomag --supplement with IV mag   DVT prophylaxis: Lovenox  SQ Code Status: Full code  Family Communication:  Level of care: Telemetry Dispo:   The patient is from: home Anticipated d/c is to: home Anticipated d/c date is: 1-2 days   Subjective and Interval History:  Pt had requested more pain meds since advancement of diet, so diet downgraded to clear liquid.   Objective: Vitals:   09/12/24 2006 09/13/24 0550 09/13/24 0741 09/13/24 1612  BP: 118/67 (!) 151/68 (!) 143/73 136/69  Pulse: 62 (!) 56 (!) 57 (!) 58  Resp: 16 16 18 14   Temp: 98.2 F (36.8 C) 98.7 F (37.1 C) 98.6 F (37 C) 98.6 F (37 C)  TempSrc: Oral Oral Oral Oral  SpO2: 100% 100% 100% 100%  Weight:      Height:         Intake/Output Summary (Last 24 hours) at 09/13/2024 1832 Last data filed at 09/13/2024 1426 Gross per 24 hour  Intake 240 ml  Output 2750 ml  Net -2510 ml   Filed Weights   09/10/24 2341  Weight: 68.6 kg    Examination:   Constitutional: NAD, AAOx3 HEENT: conjunctivae and lids normal, EOMI CV: No cyanosis.   RESP: normal respiratory effort, on RA Neuro: II - XII grossly intact.   Psych: Normal mood and affect.  Appropriate judgement and reason   Data Reviewed: I have personally reviewed labs and imaging studies  Time spent: 35 minutes  Ellouise Haber, MD [facility] If 7PM-7AM, please contact night-coverage 09/13/2024, 6:32 PM

## 2024-09-13 NOTE — TOC CM/SW Note (Signed)
 Transition of Care [facility]) - Inpatient Brief Assessment   Patient Details  Name: Vincent [name] MRN: [id number] Date of Birth: 12/04/1960  Transition of Care [facility]) CM/SW Contact:    Daved JONETTA Hamilton, RN Phone Number: 09/13/2024, 9:20 AM   Clinical Narrative:   Transition of Care [facility]) Screening Note   Patient Details  Name: Vincent [name] Date of Birth: 09-Feb-1961   Transition of Care [facility]) CM/SW Contact:    Daved JONETTA Hamilton, RN Phone Number: 09/13/2024, 9:20 AM   Food and Utility resources added to AVS.   Transition of Care Department [facility]) has reviewed patient and no TOC needs have been identified at this time. If new patient transition needs arise, please place a TOC consult.    Transition of Care Asessment: Insurance and Status: Insurance coverage has been reviewed Patient has primary care physician: Yes   Prior level of function:: Independent Prior/Current Home Services: No current home services Social Drivers of Health Review: SDOH reviewed interventions complete (Food and Utility resources added to AVS) Readmission risk has been reviewed: Yes Transition of care needs: no transition of care needs at this time

## 2024-09-13 NOTE — Discharge Instructions (Signed)
 Food Resources  Agency Name: [facility] Agency Address: [address] [place], [place] [address] Phone: [phone] Website: [email or web address] Service(s) Offered: Housing services, self-sufficiency, congregate meal program, weatherization program, Event organiser program, emergency food assistance,  housing counseling, home ownership program, wheels - to work program.  [employer] free for 60 and older at various locations from [employer], Monday-Friday:  [employer], [address] [place], [phone] -[facility], [address], [place] [phone]  -[facility], [address], [place] [phone]  -[address], [address], [place], [phone]  Agency Name: [facility] Address: [phone] [address], [place], [place] [address] Phone: [phone] Website: [email or web address] Service(s) Offered: Home delivered hot, frozen, and emergency  meals. Grocery assistance program which matches  volunteers one-on-one with seniors unable to grocery shop  for themselves. Must be 60 years and older; less than 20  hours of in-home aide service, limited or no driving ability;  live alone or with someone with a disability; live in  [place].  Agency Name: [employer] [facility] [employer]) Address: [address] [place], [place] [address] Phone: [phone] Service(s) Offered: Food is served to shut-ins, homeless, elderly, and low income people in the community every Saturday (11:30 am-12:30 pm) and Sunday (12:30 pm-1:30pm). Volunteers also offer help and encouragement in seeking employment,  and spiritual guidance.  Agency Name: [facility] Address: [address] [place], [place] [address] Phone: [phone] Service(s) Offered: Child support services; child welfare services; food stamps; Medicaid; work first family assistance; and aid  with fuel,  rent, food and medicine.  Agency Name: [employer] Address: [address] [place], [place] Phone: [phone] Website: [email or web address] Services Offered: Monday 10:00am-12:00, 8:00pm-9:00pm, and Friday 10:00am-12:00.  Agency Name: [employer] [facility] Address: [address], [place], [place] [address] Phone: [phone] Website: [email or web address] Service(s) Offered: Serves weekday meals, open from 11:30 am- 1:00 pm., and 6:30-7:30pm, Monday-Wednesday-Friday distributes food 3:30-6pm, Monday-Wednesday-Friday.  Agency Name: [facility] Address: [address] [place], [place] Phone: [phone] Website: [email or web address] Services Offered: Distributes food the 4th Saturday of the month, starting at 8:00 am  Agency Name: [facility] Address: [phone] [address] [place], [place] [address] Phone: [phone] Website: [email or web address] Service(s) Offered: Bread of life, weekly food pantry. Open Wednesdays from 10:00am-noon.  Agency Name: [facility] [employer] Address: [address] [place], [place], [place] Phone: [phone] Services Offered: Distributes food 9am-1pm, Monday-Thursday. Call for details.  Agency Name: [facility] Address: [address], [place], [place] [address] Phone: [phone] Website: [email or web address] Service(s) Offered: Games developer. Call for assistance.  Agency Name: [facility] Address: [address] [place], [place] [address] Phone: [phone] Service Offered: Emergency Food Pantry. Call for appointment.  Agency Name: [facility] Address: [address], [place], [place] [address] Phone: [phone] Website: [email or web address] Services Offered: Games developer. Call for details  Agency Name: [facility] Address: [address] [place], [place] Phone:  [phone] Website: [email or web address] Service(s) Offered: Emergency Food Pantry. Call for details.  Agency Name: Holiday representative Address: [address] [place], [place] [address] Phone: [phone] or [phone] Website: [email or web address] Service(s) Offered: Distribute food 9am-11:30 am, Tuesday-Friday, and 1-3:30pm, Monday-Friday. Food pantry Monday-Friday 1pm-3pm, fresh items, Mon.-Wed.-Fri.  Agency Name: [facility] Address: [address] [place], [place] [address] Phone: [phone] Website: [email or web address] Services Offered: Distribute food Tues and Sats from 9:00am-noon.  Closed 1st Saturday of each month. Call for details  Agency Name: [employer] Address: [place] [facility] [facility] [address] [place] [address] Phone: [phone]  Services Offered: Delivers meals every Thursday   Agency Name: [facility] Agency Address: [address] [place], [place] [address] Phone: [phone] Website: [email or web address] Service(s) Offered: Housing services, self-sufficiency, congregate meal program, and individual development account program.  Agency Name: [employer] [facility] [employer] Address: [address] [place], [place] [address] Phone: [phone] Email: [email or web address] Website: [email or web address] Service(s) Offered: Housing the homeless, feeding the hungry, Company secretary, job and education related services.  Agency Name: [facility] Address: [address], [place], [place] [address] Phone: [phone] Email: [email or web address] Service(s) Offered: Counseling, problem pregnancy, advocacy for Hispanics, limited emergency financial assistance.  Agency Name: Department [facility] Social Services Address: [address] [place], [place] [address] Phone: [phone] Website: [email or web address] Service(s) Offered: Child  support services; child welfare services; SNAP; Medicaid; work first family assistance; and aid with fuel,  rent, food and medicine.  Agency Name: Holiday representative Address: [address], [place], [place] [address] Phone: [phone] or [phone] Email: [email or web address] Service(s) Offered: Family services and transient assistance; emergency food, fuel, clothing, limited furniture, utilities; budget counseling, general counseling; give a kid a coat; thrift store; Christmas food and toys. Utility assistance, food pantry, rental  assistance, life sustaining medicine

## 2024-09-14 DIAGNOSIS — K852 Alcohol induced acute pancreatitis without necrosis or infection: Secondary | ICD-10-CM | POA: Diagnosis not present

## 2024-09-14 LAB — PHOSPHORUS: Phosphorus: 3.4 mg/dL (ref 2.5–4.6)

## 2024-09-14 LAB — MAGNESIUM: Magnesium: 1.5 mg/dL — ABNORMAL LOW (ref 1.7–2.4)

## 2024-09-14 MED ORDER — MAGNESIUM SULFATE 4 GM/100ML IV SOLN
4.0000 g | Freq: Once | INTRAVENOUS | Status: AC
  Administered 2024-09-14: 4 g via INTRAVENOUS
  Filled 2024-09-14 (×2): qty 100

## 2024-09-14 MED ORDER — SODIUM CHLORIDE 0.9 % IV SOLN: INTRAVENOUS | Status: DC

## 2024-09-14 MED ORDER — POLYETHYLENE GLYCOL 3350 17 G PO PACK
34.0000 g | PACK | Freq: Two times a day (BID) | ORAL | Status: DC
  Administered 2024-09-14 – 2024-09-15 (×2): 34 g via ORAL
  Filled 2024-09-14 (×3): qty 2

## 2024-09-14 NOTE — Progress Notes (Signed)
  PROGRESS NOTE    Vincent [name]  FMW:[id number] DOB: [birth year] DOA: 09/10/2024 [facility]CP: [facility]  121A/121A-BB  LOS: 4 days   Brief hospital course:   Assessment & Plan: Vincent [name] is a 63 y.o. male with medical history significant of type 2 diabetes, chronic kidney disease stage III, COPD, gout, essential hypertension, coronary artery disease, paroxysmal atrial fibrillation, history of hepatitis C, alcohol abuse history who presented to the ER with abdominal pain.  Pain is generalized.  No fever or chills some nausea but no vomiting no diarrhea no bright red blood per rectum.  Patient denies heavy alcohol use but has had drinks lately.    #1 acute on chronic pancreatitis: Suspected due to alcoholism.   --cont MIVF --attempts to upgrade to low-fat diet resulted in pt asking for more pain meds --cont clear liquid diet until pt's abdominal pain improves and doesn't need opioids. --Norco PRN   #3 essential hypertension:  --BP wnl   #4 history of alcohol abuse:  Monitor for possible withdrawal.     #5 paroxysmal atrial fibrillation:  --rate controlled   #6 peripheral vascular disease:  Status post left AKA.     #7 hyponatremia:  --improved with IVF --cont MIVF   #8 chronic kidney disease stage IIIa, ruled out   #9 UTI, ruled out --hold abx  Hypomag --monitor and supplement PRN with IV mag    DVT prophylaxis: Lovenox  SQ Code Status: Full code  Family Communication:  Level of care: Telemetry Dispo:   The patient is from: home Anticipated d/c is to: home Anticipated d/c date is: 1-2 days   Subjective and Interval History:  Pt requested more pain meds overnight, including IV morphine  4 mg x2.    This morning, pt said his pain was better.   Objective: Vitals:   09/14/24 0441 09/14/24 0818 09/14/24 1552 09/14/24 1932  BP: (!) 156/82 133/68 (!) 144/84 (!) 167/96  Pulse: 60 63 (!) 57 (!) 56  Resp: 16 16 18 18   Temp: 98.5 F (36.9  C) 97.9 F (36.6 C) 98.2 F (36.8 C) 98.6 F (37 C)  TempSrc:   Oral Oral  SpO2: 100% 100% 100% 100%  Weight:      Height:        Intake/Output Summary (Last 24 hours) at 09/14/2024 2239 Last data filed at 09/14/2024 2151 Gross per 24 hour  Intake 1528.75 ml  Output 1200 ml  Net 328.75 ml   Filed Weights   09/10/24 2341  Weight: 68.6 kg    Examination:   Constitutional: NAD, AAOx3 HEENT: conjunctivae and lids normal, EOMI CV: No cyanosis.   RESP: normal respiratory effort, on RA Neuro: II - XII grossly intact.     Data Reviewed: I have personally reviewed labs and imaging studies  Time spent: 35 minutes  Ellouise Haber, MD [facility] If 7PM-7AM, please contact night-coverage 09/14/2024, 10:39 PM

## 2024-09-15 LAB — MAGNESIUM: Magnesium: 1.7 mg/dL (ref 1.7–2.4)

## 2024-09-15 NOTE — Plan of Care (Signed)

## 2024-09-15 NOTE — Discharge Summary (Signed)
 DISCHARGE SUMMARY    Vincent [name] FMW:[id number] DOB: [birth year] DOA: 09/10/2024  PCP: [facility]  Admit date: 09/10/2024 Discharge date: 09/15/2024   Recommendations for Outpatient Follow-up:  Follow up with PCP in 1-2  weeks to review chronic condition management including high blood pressure, A-fib, diabetes.  Hospital Course:  Vincent [name] is a 63 year old male with type 2 diabetes, COPD, gout, hypertension, CAD, paroxysmal A-fib no longer on anticoagulation, history of hepatitis C, history of alcohol abuse, peripheral vascular disease status post bilateral AKAs, who presents to the ER with abdominal pain.  He was admitted for acute pancreatitis.  Patient's diet was gradually advanced and by 11/26 he had no issues with diet and reported his pain was completely resolved.  He is taking Ozempic outpatient for diabetes.  I have advised him to discuss with his PCP for alternative medication.  He reports he has an upcoming appointment this week with his PCP. TOC consulted to assist in transportation arrangements.  Pancreatitis - Patient reports he is no longer a daily drinker.  Drinks occasionally on the weekend.  Is also taking Ozempic, have recommended to discontinue this medication and discuss alternative diabetes meds with his PCP.  Patient is not obese, and most recent hemoglobin A1c is less than 7% - Advised to discontinue alcohol  Hypertension - Blood pressure currently near goal without medication.  Patient reports he is no longer taking any medications at home - Blood pressure may have been elevated during this admission secondary to pain.  Follow-up closely with PCP  History of alcohol abuse - No evidence of withdrawal during this admission - Patient denies daily alcohol  Paroxysmal A-fib - Has been rate controlled throughout this admission.  Reports he is no longer taking anticoagulation  Peripheral vascular disease - Status post bilateral  AKA's  Hyponatremia magnesium  Mia - Mostly resolved  CKD stage IIIa, ruled out.  GFR consistently above 60  BMI 40 - Inaccurate representation of height/weight.  Patient is currently listed as 129 cm secondary to bilateral AKA's.  Clinically his weight appears appropriate for stature  Discharge Instructions  Discharge Instructions     Call MD for:  difficulty breathing, headache or visual disturbances   Complete by: As directed    Call MD for:  persistant dizziness or light-headedness   Complete by: As directed    Call MD for:  persistant nausea and vomiting   Complete by: As directed    Call MD for:  severe uncontrolled pain   Complete by: As directed    Call MD for:  temperature >100.4   Complete by: As directed    Diet general   Complete by: As directed    Discharge instructions   Complete by: As directed    Follow-up with your primary care physician to discuss an alternative medication to Ozempic.   Increase activity slowly   Complete by: As directed       Allergies as of 09/15/2024   No Known Allergies      Medication List     PAUSE taking these medications    Ozempic (0.25 or 0.5 MG/DOSE) 2 MG/3ML Sopn Wait to take this until your doctor or other care provider tells you to start again. Generic drug: Semaglutide(0.25 or 0.5MG /DOS) Inject 0.5 mg into the skin once a week.       STOP taking these medications    gabapentin  300 MG capsule Commonly known as: Neurontin        TAKE these  medications    Acetaminophen  Extra Strength 500 MG Tabs Take 500 mg by mouth daily.   methocarbamol 500 MG tablet Commonly known as: ROBAXIN Take 500 mg by mouth.        Follow-up Information     [facility] Follow up.   Specialty: General Practice Why: hospital follow up Contact information: [address] [place] [address] [phone]                No Known  Allergies  Consultations:    Procedures/Studies: CT ABDOMEN PELVIS W CONTRAST Result Date: 09/10/2024 EXAM: CT ABDOMEN AND PELVIS WITH CONTRAST 09/10/2024 10:32:37 PM TECHNIQUE: CT of the abdomen and pelvis was performed with the administration of 100 mL of iohexol  (OMNIPAQUE ) 300 MG/ML solution. Multiplanar reformatted images are provided for review. Automated exposure control, iterative reconstruction, and/or weight-based adjustment of the mA/kV was utilized to reduce the radiation dose to as low as reasonably achievable. COMPARISON: 10/29/2023 CLINICAL HISTORY: Generalized abdominal pain. FINDINGS: LOWER CHEST: Lung bases again demonstrate scarring along the major fissure laterally, improved when compared with the prior exam. LIVER: The liver is unremarkable. GALLBLADDER AND BILE DUCTS: Gallbladder is unremarkable. No biliary ductal dilatation. SPLEEN: No acute abnormality. PANCREAS: The pancreas demonstrates diffuse scattered calcifications consistent with chronic pancreatitis. Some very mild peripancreatic inflammatory change is noted consistent with acute pancreatitis. No definitive pancreatic necrosis is noted at this time. ADRENAL GLANDS: No acute abnormality. KIDNEYS, URETERS AND BLADDER: Kidneys demonstrate a normal enhancement pattern. No renal calculi obstructive changes are noted. The bladder is partially distended. No hydronephrosis. GI AND BOWEL: The peripancreatic inflammatory changes abut the posterior wall of the stomach. No obstructive or inflammatory changes of the colon are seen. Scattered fecal material is noted. The appendix is not well seen and may have been surgically removed. No inflammatory changes to suggest appendicitis are noted. The small bowel is within normal limits. PERITONEUM AND RETROPERITONEUM: No free fluid is noted. No free air. VASCULATURE: Prominent aortic calcifications are noted. No aneurysmal dilatation is seen. LYMPH NODES: No lymphadenopathy. REPRODUCTIVE  ORGANS: The prostate is within normal limits. BONES AND SOFT TISSUES: Bony structures show degenerative change of the lumbar spine. IMPRESSION: 1. Changes of acute on chronic pancreatitis with mild peripancreatic inflammatory change extending towards the stomach. No pancreatic necrosis is noted at this time. No Pancreatic pseudocyst is seen. Electronically signed by: Oneil Devonshire MD 09/10/2024 10:41 PM EST RP Workstation: [id number]      Discharge Exam: Vitals:   09/15/24 0506 09/15/24 0817  BP: 139/78 (!) 149/77  Pulse: 63 65  Resp: 20 16  Temp: 98.7 F (37.1 C) 99 F (37.2 C)  SpO2: 100% 100%   Vitals:   09/14/24 1552 09/14/24 1932 09/15/24 0506 09/15/24 0817  BP: (!) 144/84 (!) 167/96 139/78 (!) 149/77  Pulse: (!) 57 (!) 56 63 65  Resp: 18 18 20 16   Temp: 98.2 F (36.8 C) 98.6 F (37 C) 98.7 F (37.1 C) 99 F (37.2 C)  TempSrc: Oral Oral Oral Oral  SpO2: 100% 100% 100% 100%  Weight:      Height:        Constitutional:  Normal appearance. Non toxic-appearing.  HENT: Head Normocephalic and atraumatic.  Mucous membranes are moist.  Eyes:  Extraocular intact. Conjunctivae normal.  Cardiovascular: Rate and Rhythm: Normal rate and regular rhythm.  Pulmonary: Non labored, symmetric rise of chest wall. Abdomen: non distended, tenderness to palpation Skin: warm and dry. not jaundiced.  Neurological:  No focal deficit present. alert. Oriented.  Psychiatric: Mood and Affect congruent.    The results of significant diagnostics from this hospitalization (including imaging, microbiology, ancillary and laboratory) are listed below for reference.     Microbiology: No results found for this or any previous visit (from the past 240 hours).   Labs: BNP (last 3 results) No results for input(s): BNP in the last 8760 hours. Basic Metabolic Panel: Recent Labs  Lab 09/10/24 2013 09/10/24 2340 09/11/24 0555 09/12/24 0445 09/12/24 0446 09/13/24 0618 09/14/24 0432 09/15/24 0445   NA 123*  --  130*  --  134*  --   --   --   K 4.2  --  4.6  --  4.2  --   --   --   CL 89*  --  100  --  106  --   --   --   CO2 20*  --  22  --  20*  --   --   --   GLUCOSE 228*  --  134*  --  111*  --   --   --   BUN 8  --  9  --  7*  --   --   --   CREATININE 0.88 0.81 0.81  --  0.71  --   --   --   CALCIUM  8.8*  --  8.3*  --  8.2*  --   --   --   MG  --   --   --   --  1.3* 1.5* 1.5* 1.7  PHOS  --   --   --  2.9  --   --  3.4  --    Liver Function Tests: Recent Labs  Lab 09/10/24 2013 09/11/24 0555  AST 19 15  ALT 23 16  ALKPHOS 105 88  BILITOT 0.5 0.5  PROT 7.5 6.6  ALBUMIN  3.4* 3.1*   Recent Labs  Lab 09/10/24 2013 09/12/24 0446  LIPASE 708* 220*   No results for input(s): AMMONIA in the last 168 hours. CBC: Recent Labs  Lab 09/10/24 2013 09/10/24 2340 09/11/24 0555  WBC 8.6 8.5 6.4  HGB 15.1 14.2 13.2  HCT 45.1 41.0 39.3  MCV 86.4 84.2 85.6  PLT 206 185 179   Cardiac Enzymes: No results for input(s): CKTOTAL, CKMB, CKMBINDEX, TROPONINI in the last 168 hours. BNP: Invalid input(s): POCBNP CBG: Recent Labs  Lab 09/10/24 2014  GLUCAP 252*   D-Dimer No results for input(s): DDIMER in the last 72 hours. Hgb A1c No results for input(s): HGBA1C in the last 72 hours. Lipid Profile No results for input(s): CHOL, HDL, LDLCALC, TRIG, CHOLHDL, LDLDIRECT in the last 72 hours. Thyroid  function studies No results for input(s): TSH, T4TOTAL, T3FREE, THYROIDAB in the last 72 hours.  Invalid input(s): FREET3 Anemia work up No results for input(s): VITAMINB12, FOLATE, FERRITIN, TIBC, IRON, RETICCTPCT in the last 72 hours. Urinalysis    Component Value Date/Time   COLORURINE YELLOW (A) 09/10/2024 2013   APPEARANCEUR HAZY (A) 09/10/2024 2013   APPEARANCEUR Clear 09/24/2014 1034   LABSPEC 1.009 09/10/2024 2013   LABSPEC 1.006 09/24/2014 1034   PHURINE 5.0 09/10/2024 2013   GLUCOSEU 150 (A) 09/10/2024 2013    GLUCOSEU Negative 09/24/2014 1034   HGBUR NEGATIVE 09/10/2024 2013   BILIRUBINUR NEGATIVE 09/10/2024 2013   BILIRUBINUR Negative 09/24/2014 1034   KETONESUR 5 (A) 09/10/2024 2013   PROTEINUR NEGATIVE 09/10/2024 2013   NITRITE POSITIVE (A) 09/10/2024 2013  LEUKOCYTESUR SMALL (A) 09/10/2024 2013   LEUKOCYTESUR Negative 09/24/2014 1034   Sepsis Labs Recent Labs  Lab 09/10/24 2013 09/10/24 2340 09/11/24 0555  WBC 8.6 8.5 6.4   Microbiology No results found for this or any previous visit (from the past 240 hours).   Time coordinating discharge: 32 min  SIGNED: Jakota Manthei, DO [facility] 09/15/2024, 3:21 PM Pager   If 7PM-7AM, please contact night-coverage

## 2024-09-15 NOTE — Plan of Care (Signed)
  Problem: Education: Goal: Knowledge of General Education information will improve Description: Including pain rating scale, medication(s)/side effects and non-pharmacologic comfort measures 09/15/2024 1612 by Estelle Nat SAILOR, RN Outcome: Adequate for Discharge 09/15/2024 1604 by Estelle Nat SAILOR, RN Outcome: Progressing   Problem: Health Behavior/Discharge Planning: Goal: Ability to manage health-related needs will improve 09/15/2024 1612 by Estelle Nat SAILOR, RN Outcome: Adequate for Discharge 09/15/2024 1604 by Estelle Nat SAILOR, RN Outcome: Progressing   Problem: Clinical Measurements: Goal: Ability to maintain clinical measurements within normal limits will improve 09/15/2024 1612 by Estelle Nat SAILOR, RN Outcome: Adequate for Discharge 09/15/2024 1604 by Estelle Nat SAILOR, RN Outcome: Progressing Goal: Will remain free from infection 09/15/2024 1612 by Estelle Nat SAILOR, RN Outcome: Adequate for Discharge 09/15/2024 1604 by Estelle Nat SAILOR, RN Outcome: Progressing Goal: Diagnostic test results will improve 09/15/2024 1612 by Estelle Nat SAILOR, RN Outcome: Adequate for Discharge 09/15/2024 1604 by Estelle Nat SAILOR, RN Outcome: Progressing Goal: Respiratory complications will improve 09/15/2024 1612 by Estelle Nat SAILOR, RN Outcome: Adequate for Discharge 09/15/2024 1604 by Estelle Nat SAILOR, RN Outcome: Progressing Goal: Cardiovascular complication will be avoided 09/15/2024 1612 by Estelle Nat SAILOR, RN Outcome: Adequate for Discharge 09/15/2024 1604 by Estelle Nat SAILOR, RN Outcome: Progressing   Problem: Activity: Goal: Risk for activity intolerance will decrease 09/15/2024 1612 by Estelle Nat SAILOR, RN Outcome: Adequate for Discharge 09/15/2024 1604 by Estelle Nat SAILOR, RN Outcome: Progressing   Problem: Nutrition: Goal: Adequate nutrition will be maintained 09/15/2024 1612 by Estelle Nat SAILOR,  RN Outcome: Adequate for Discharge 09/15/2024 1604 by Estelle Nat SAILOR, RN Outcome: Progressing   Problem: Coping: Goal: Level of anxiety will decrease 09/15/2024 1612 by Estelle Nat SAILOR, RN Outcome: Adequate for Discharge 09/15/2024 1604 by Estelle Nat SAILOR, RN Outcome: Progressing   Problem: Elimination: Goal: Will not experience complications related to bowel motility 09/15/2024 1612 by Estelle Nat SAILOR, RN Outcome: Adequate for Discharge 09/15/2024 1604 by Estelle Nat SAILOR, RN Outcome: Progressing Goal: Will not experience complications related to urinary retention 09/15/2024 1612 by Estelle Nat SAILOR, RN Outcome: Adequate for Discharge 09/15/2024 1604 by Estelle Nat SAILOR, RN Outcome: Progressing   Problem: Pain Managment: Goal: General experience of comfort will improve and/or be controlled 09/15/2024 1612 by Estelle Nat SAILOR, RN Outcome: Adequate for Discharge 09/15/2024 1604 by Estelle Nat SAILOR, RN Outcome: Progressing   Problem: Safety: Goal: Ability to remain free from injury will improve 09/15/2024 1612 by Estelle Nat SAILOR, RN Outcome: Adequate for Discharge 09/15/2024 1604 by Estelle Nat SAILOR, RN Outcome: Progressing   Problem: Skin Integrity: Goal: Risk for impaired skin integrity will decrease 09/15/2024 1612 by Estelle Nat SAILOR, RN Outcome: Adequate for Discharge 09/15/2024 1604 by Estelle Nat SAILOR, RN Outcome: Progressing

## 2024-10-12 ENCOUNTER — Emergency Department

## 2024-10-12 ENCOUNTER — Other Ambulatory Visit: Payer: Self-pay

## 2024-10-12 ENCOUNTER — Inpatient Hospital Stay
Admission: EM | Admit: 2024-10-12 | Discharge: 2024-10-16 | Disposition: A | Discharge status: Home / Self Care | Attending: Internal Medicine | Admitting: Internal Medicine

## 2024-10-12 DIAGNOSIS — E871 Hypo-osmolality and hyponatremia: Secondary | ICD-10-CM | POA: Diagnosis present

## 2024-10-12 DIAGNOSIS — N189 Chronic kidney disease, unspecified: Secondary | ICD-10-CM | POA: Diagnosis present

## 2024-10-12 DIAGNOSIS — Z89611 Acquired absence of right leg above knee: Secondary | ICD-10-CM

## 2024-10-12 DIAGNOSIS — K2970 Gastritis, unspecified, without bleeding: Secondary | ICD-10-CM | POA: Diagnosis present

## 2024-10-12 DIAGNOSIS — R1085 Abdominal pain of multiple sites: Secondary | ICD-10-CM

## 2024-10-12 DIAGNOSIS — J42 Unspecified chronic bronchitis: Secondary | ICD-10-CM | POA: Diagnosis not present

## 2024-10-12 DIAGNOSIS — E1142 Type 2 diabetes mellitus with diabetic polyneuropathy: Secondary | ICD-10-CM | POA: Diagnosis not present

## 2024-10-12 DIAGNOSIS — E66813 Obesity, class 3: Secondary | ICD-10-CM | POA: Diagnosis not present

## 2024-10-12 DIAGNOSIS — F1090 Alcohol use, unspecified, uncomplicated: Secondary | ICD-10-CM | POA: Diagnosis not present

## 2024-10-12 DIAGNOSIS — Z833 Family history of diabetes mellitus: Secondary | ICD-10-CM

## 2024-10-12 DIAGNOSIS — Z6841 Body Mass Index (BMI) 40.0 and over, adult: Secondary | ICD-10-CM

## 2024-10-12 DIAGNOSIS — Z7985 Long-term (current) use of injectable non-insulin antidiabetic drugs: Secondary | ICD-10-CM

## 2024-10-12 DIAGNOSIS — K859 Acute pancreatitis without necrosis or infection, unspecified: Secondary | ICD-10-CM | POA: Diagnosis not present

## 2024-10-12 DIAGNOSIS — F17210 Nicotine dependence, cigarettes, uncomplicated: Secondary | ICD-10-CM | POA: Diagnosis present

## 2024-10-12 DIAGNOSIS — E860 Dehydration: Secondary | ICD-10-CM | POA: Diagnosis present

## 2024-10-12 DIAGNOSIS — I480 Paroxysmal atrial fibrillation: Secondary | ICD-10-CM | POA: Diagnosis present

## 2024-10-12 DIAGNOSIS — I10 Essential (primary) hypertension: Secondary | ICD-10-CM | POA: Diagnosis present

## 2024-10-12 DIAGNOSIS — Z720 Tobacco use: Secondary | ICD-10-CM | POA: Diagnosis present

## 2024-10-12 DIAGNOSIS — R112 Nausea with vomiting, unspecified: Secondary | ICD-10-CM

## 2024-10-12 DIAGNOSIS — K838 Other specified diseases of biliary tract: Secondary | ICD-10-CM

## 2024-10-12 DIAGNOSIS — Z79899 Other long term (current) drug therapy: Secondary | ICD-10-CM

## 2024-10-12 DIAGNOSIS — N400 Enlarged prostate without lower urinary tract symptoms: Secondary | ICD-10-CM | POA: Diagnosis present

## 2024-10-12 DIAGNOSIS — I129 Hypertensive chronic kidney disease with stage 1 through stage 4 chronic kidney disease, or unspecified chronic kidney disease: Secondary | ICD-10-CM | POA: Diagnosis present

## 2024-10-12 DIAGNOSIS — F1010 Alcohol abuse, uncomplicated: Secondary | ICD-10-CM | POA: Diagnosis present

## 2024-10-12 DIAGNOSIS — E1122 Type 2 diabetes mellitus with diabetic chronic kidney disease: Secondary | ICD-10-CM | POA: Diagnosis present

## 2024-10-12 DIAGNOSIS — R933 Abnormal findings on diagnostic imaging of other parts of digestive tract: Secondary | ICD-10-CM | POA: Diagnosis present

## 2024-10-12 DIAGNOSIS — Z794 Long term (current) use of insulin: Secondary | ICD-10-CM

## 2024-10-12 DIAGNOSIS — K861 Other chronic pancreatitis: Secondary | ICD-10-CM | POA: Diagnosis not present

## 2024-10-12 DIAGNOSIS — E785 Hyperlipidemia, unspecified: Secondary | ICD-10-CM | POA: Diagnosis present

## 2024-10-12 DIAGNOSIS — J449 Chronic obstructive pulmonary disease, unspecified: Secondary | ICD-10-CM | POA: Diagnosis present

## 2024-10-12 DIAGNOSIS — Z89612 Acquired absence of left leg above knee: Secondary | ICD-10-CM

## 2024-10-12 DIAGNOSIS — I2510 Atherosclerotic heart disease of native coronary artery without angina pectoris: Secondary | ICD-10-CM | POA: Diagnosis present

## 2024-10-12 LAB — URINALYSIS, ROUTINE W REFLEX MICROSCOPIC
Bilirubin Urine: NEGATIVE
Glucose, UA: NEGATIVE mg/dL
Hgb urine dipstick: NEGATIVE
Ketones, ur: 20 mg/dL — AB
Leukocytes,Ua: NEGATIVE
Nitrite: NEGATIVE
Protein, ur: NEGATIVE mg/dL
Specific Gravity, Urine: 1.029 (ref 1.005–1.030)
pH: 5 (ref 5.0–8.0)

## 2024-10-12 LAB — GLUCOSE, CAPILLARY: Glucose-Capillary: 115 mg/dL — ABNORMAL HIGH (ref 70–99)

## 2024-10-12 LAB — COMPREHENSIVE METABOLIC PANEL WITH GFR
ALT: 6 U/L (ref 0–44)
AST: 17 U/L (ref 15–41)
Albumin: 3.5 g/dL (ref 3.5–5.0)
Alkaline Phosphatase: 113 U/L (ref 38–126)
Anion gap: 15 (ref 5–15)
BUN: 13 mg/dL (ref 8–23)
CO2: 23 mmol/L (ref 22–32)
Calcium: 8.9 mg/dL (ref 8.9–10.3)
Chloride: 89 mmol/L — ABNORMAL LOW (ref 98–111)
Creatinine, Ser: 1.01 mg/dL (ref 0.61–1.24)
GFR, Estimated: >60 mL/min
Glucose, Bld: 137 mg/dL — ABNORMAL HIGH (ref 70–99)
Potassium: 4.7 mmol/L (ref 3.5–5.1)
Sodium: 126 mmol/L — ABNORMAL LOW (ref 135–145)
Total Bilirubin: 1.3 mg/dL — ABNORMAL HIGH (ref 0.0–1.2)
Total Protein: 7.6 g/dL (ref 6.5–8.1)

## 2024-10-12 LAB — PHOSPHORUS: Phosphorus: 3.3 mg/dL (ref 2.5–4.6)

## 2024-10-12 LAB — CBC
HCT: 43.7 % (ref 39.0–52.0)
Hemoglobin: 14.9 g/dL (ref 13.0–17.0)
MCH: 28.7 pg (ref 26.0–34.0)
MCHC: 34.1 g/dL (ref 30.0–36.0)
MCV: 84 fL (ref 80.0–100.0)
Platelets: 219 K/uL (ref 150–400)
RBC: 5.2 MIL/uL (ref 4.22–5.81)
RDW: 15.5 % (ref 11.5–15.5)
WBC: 7.4 K/uL (ref 4.0–10.5)
nRBC: 0 % (ref 0.0–0.2)

## 2024-10-12 LAB — LIPASE, BLOOD: Lipase: 933 U/L — ABNORMAL HIGH (ref 11–51)

## 2024-10-12 LAB — TRIGLYCERIDES: Triglycerides: 85 mg/dL

## 2024-10-12 LAB — OSMOLALITY: Osmolality: 267 mosm/kg — ABNORMAL LOW (ref 275–295)

## 2024-10-12 LAB — MAGNESIUM: Magnesium: 1.4 mg/dL — ABNORMAL LOW (ref 1.7–2.4)

## 2024-10-12 MED ORDER — METOPROLOL TARTRATE 5 MG/5ML IV SOLN: 2.5000 mg | INTRAVENOUS | Status: DC | PRN

## 2024-10-12 MED ORDER — HYDROMORPHONE HCL 1 MG/ML IJ SOLN
1.0000 mg | INTRAMUSCULAR | Status: DC | PRN
  Administered 2024-10-13 – 2024-10-16 (×16): 1 mg via INTRAVENOUS
  Filled 2024-10-12 (×16): qty 1

## 2024-10-12 MED ORDER — DM-GUAIFENESIN ER 30-600 MG PO TB12: 1.0000 | ORAL_TABLET | Freq: Two times a day (BID) | ORAL | Status: DC | PRN

## 2024-10-12 MED ORDER — INSULIN ASPART 100 UNIT/ML IJ SOLN
0.0000 [IU] | Freq: Three times a day (TID) | INTRAMUSCULAR | Status: DC
  Administered 2024-10-13 – 2024-10-16 (×4): 2 [IU] via SUBCUTANEOUS
  Filled 2024-10-12 (×4): qty 2
  Filled 2024-10-12: qty 1

## 2024-10-12 MED ORDER — IOHEXOL 300 MG/ML  SOLN
100.0000 mL | Freq: Once | INTRAMUSCULAR | Status: AC | PRN
  Administered 2024-10-12: 100 mL via INTRAVENOUS

## 2024-10-12 MED ORDER — INSULIN GLARGINE-YFGN 100 UNIT/ML ~~LOC~~ SOLN
5.0000 [IU] | Freq: Every day | SUBCUTANEOUS | Status: DC
  Administered 2024-10-13 – 2024-10-15 (×3): 5 [IU] via SUBCUTANEOUS
  Filled 2024-10-12 (×5): qty 0.05

## 2024-10-12 MED ORDER — ONDANSETRON HCL 4 MG/2ML IJ SOLN: 4.0000 mg | Freq: Three times a day (TID) | INTRAMUSCULAR | Status: DC | PRN

## 2024-10-12 MED ORDER — SODIUM CHLORIDE 0.9 % IV BOLUS
1000.0000 mL | Freq: Once | INTRAVENOUS | Status: AC
  Administered 2024-10-12: 1000 mL via INTRAVENOUS

## 2024-10-12 MED ORDER — MORPHINE SULFATE (PF) 4 MG/ML IV SOLN
4.0000 mg | Freq: Once | INTRAVENOUS | Status: AC
  Administered 2024-10-12: 4 mg via INTRAVENOUS
  Filled 2024-10-12: qty 1

## 2024-10-12 MED ORDER — NICOTINE 21 MG/24HR TD PT24
21.0000 mg | MEDICATED_PATCH | Freq: Every day | TRANSDERMAL | Status: DC
  Filled 2024-10-12 (×3): qty 1

## 2024-10-12 MED ORDER — HYDRALAZINE HCL 20 MG/ML IJ SOLN
5.0000 mg | INTRAMUSCULAR | Status: DC | PRN
  Administered 2024-10-14: 5 mg via INTRAVENOUS
  Filled 2024-10-12: qty 1

## 2024-10-12 MED ORDER — FOLIC ACID 1 MG PO TABS
1.0000 mg | ORAL_TABLET | Freq: Every day | ORAL | Status: DC
  Administered 2024-10-12 – 2024-10-16 (×5): 1 mg via ORAL
  Filled 2024-10-12 (×5): qty 1

## 2024-10-12 MED ORDER — ALBUTEROL SULFATE (2.5 MG/3ML) 0.083% IN NEBU: 3.0000 mL | INHALATION_SOLUTION | RESPIRATORY_TRACT | Status: DC | PRN

## 2024-10-12 MED ORDER — SODIUM CHLORIDE 0.9 % IV SOLN: INTRAVENOUS | Status: DC

## 2024-10-12 MED ORDER — THIAMINE MONONITRATE 100 MG PO TABS
100.0000 mg | ORAL_TABLET | Freq: Every day | ORAL | Status: DC
  Administered 2024-10-12 – 2024-10-16 (×5): 100 mg via ORAL
  Filled 2024-10-12 (×5): qty 1

## 2024-10-12 MED ORDER — INSULIN ASPART 100 UNIT/ML IJ SOLN: 0.0000 [IU] | Freq: Every day | INTRAMUSCULAR | Status: DC

## 2024-10-12 MED ORDER — ACETAMINOPHEN 325 MG PO TABS
650.0000 mg | ORAL_TABLET | Freq: Four times a day (QID) | ORAL | Status: DC | PRN
  Administered 2024-10-12: 650 mg via ORAL
  Filled 2024-10-12: qty 2

## 2024-10-12 MED ORDER — SODIUM CHLORIDE 1 G PO TABS
1.0000 g | ORAL_TABLET | Freq: Two times a day (BID) | ORAL | Status: DC
  Administered 2024-10-13 – 2024-10-16 (×7): 1 g via ORAL
  Filled 2024-10-12 (×7): qty 1

## 2024-10-12 MED ORDER — SENNOSIDES-DOCUSATE SODIUM 8.6-50 MG PO TABS
1.0000 | ORAL_TABLET | Freq: Every evening | ORAL | Status: DC | PRN
  Administered 2024-10-12 – 2024-10-13 (×2): 1 via ORAL
  Filled 2024-10-12 (×2): qty 1

## 2024-10-12 MED ORDER — PANTOPRAZOLE SODIUM 40 MG IV SOLR
40.0000 mg | Freq: Once | INTRAVENOUS | Status: AC
  Administered 2024-10-12: 40 mg via INTRAVENOUS
  Filled 2024-10-12: qty 10

## 2024-10-12 MED ORDER — MAGNESIUM SULFATE 2 GM/50ML IV SOLN
2.0000 g | Freq: Once | INTRAVENOUS | Status: AC
  Administered 2024-10-12: 2 g via INTRAVENOUS
  Filled 2024-10-12: qty 50

## 2024-10-12 MED ORDER — OXYCODONE HCL 5 MG PO TABS
5.0000 mg | ORAL_TABLET | Freq: Four times a day (QID) | ORAL | Status: DC | PRN
  Administered 2024-10-12 – 2024-10-16 (×4): 5 mg via ORAL
  Filled 2024-10-12 (×5): qty 1

## 2024-10-12 MED ORDER — PANTOPRAZOLE SODIUM 40 MG PO TBEC
40.0000 mg | DELAYED_RELEASE_TABLET | Freq: Every day | ORAL | Status: DC
  Administered 2024-10-13 – 2024-10-15 (×3): 40 mg via ORAL
  Filled 2024-10-12 (×3): qty 1

## 2024-10-12 NOTE — ED Triage Notes (Signed)
 Pt comes via EMS from home with c/o belly pain for 4 days the patient states pain all over and sides. Pt denies any N/V

## 2024-10-12 NOTE — ED Provider Notes (Signed)
 [facility] Provider Note    Event Date/Time   First MD Initiated Contact with Patient 10/12/24 1554     (approximate)   History   Abdominal Pain   HPI  Vincent [name] is a 63 y.o. male with history of COPD, CAD, diabetes, CKD, hypertension, presenting with 4 to 5 days of abdominal pain.  States it is generalized but worse in the epigastric region.  Did have several episodes of nausea vomiting.  Denies any diarrhea, no urinary symptoms or fever.  States that he has had decreased p.o. intake for the last several days.  No chest pain or shortness of breath.  On independent chart review, he was admitted in November for pancreatitis, was found to have a lipase of 708, also hyponatremia at the time to 123.  Had a CT scan done at the time that showed acute on chronic pancreatitis.     Physical Exam   Triage Vital Signs: ED Triage Vitals  Encounter Vitals Group     BP 10/12/24 1112 122/87     Girls Systolic BP Percentile --      Girls Diastolic BP Percentile --      Boys Systolic BP Percentile --      Boys Diastolic BP Percentile --      Pulse Rate 10/12/24 1112 (!) 109     Resp 10/12/24 1112 18     Temp 10/12/24 1112 98.4 F (36.9 C)     Temp Source 10/12/24 1446 Oral     SpO2 10/12/24 1112 98 %     Weight 10/12/24 1113 150 lb (68 kg)     Height --      Head Circumference --      Peak Flow --      Pain Score 10/12/24 1112 10     Pain Loc --      Pain Education --      Exclude from Growth Chart --     Most recent vital signs: Vitals:   10/12/24 1446 10/12/24 2024  BP: 137/84 112/68  Pulse: (!) 107 91  Resp: 20 19  Temp: 98.7 F (37.1 C) 99.1 F (37.3 C)  SpO2: 100% 96%     General: Awake, no distress.  CV:  Good peripheral perfusion.  Resp:  Normal effort.  No tachypnea or respiratory distress Abd:  No distention.  Soft, generalized tenderness worse in the epigastrium and right upper quadrant.  No guarding Other:  Slightly dry  mucous membranes.   ED Results / Procedures / Treatments   Labs (all labs ordered are listed, but only abnormal results are displayed) Labs Reviewed  LIPASE, BLOOD - Abnormal; Notable for the following components:      Result Value   Lipase 933 (*)    All other components within normal limits  COMPREHENSIVE METABOLIC PANEL WITH GFR - Abnormal; Notable for the following components:   Sodium 126 (*)    Chloride 89 (*)    Glucose, Bld 137 (*)    Total Bilirubin 1.3 (*)    All other components within normal limits  URINALYSIS, ROUTINE W REFLEX MICROSCOPIC - Abnormal; Notable for the following components:   Color, Urine YELLOW (*)    APPearance CLEAR (*)    Ketones, ur 20 (*)    All other components within normal limits  CBC  MAGNESIUM   PHOSPHORUS  OSMOLALITY  OSMOLALITY, URINE  SODIUM, URINE, RANDOM  BASIC METABOLIC PANEL WITH GFR  CBC  COMPREHENSIVE METABOLIC PANEL  WITH GFR  BASIC METABOLIC PANEL WITH GFR  TRIGLYCERIDES     RADIOLOGY On my independent interpretation, right upper quadrant ultrasound shows sludge   PROCEDURES:  Critical Care performed: No  Procedures   MEDICATIONS ORDERED IN ED: Medications  albuterol  (PROVENTIL ) (2.5 MG/3ML) 0.083% nebulizer solution 3 mL (has no administration in time range)  dextromethorphan-guaiFENesin  (MUCINEX  DM) 30-600 MG per 12 hr tablet 1 tablet (has no administration in time range)  sodium chloride  tablet 1 g (has no administration in time range)  0.9 %  sodium chloride  infusion (has no administration in time range)  ondansetron  (ZOFRAN ) injection 4 mg (has no administration in time range)  hydrALAZINE  (APRESOLINE ) injection 5 mg (has no administration in time range)  acetaminophen  (TYLENOL ) tablet 650 mg (has no administration in time range)  HYDROmorphone  (DILAUDID ) injection 1 mg (has no administration in time range)  oxyCODONE  (Oxy IR/ROXICODONE ) immediate release tablet 5 mg (has no administration in time range)   nicotine  (NICODERM CQ  - dosed in mg/24 hours) patch 21 mg (has no administration in time range)  sodium chloride  0.9 % bolus 1,000 mL (1,000 mLs Intravenous New Bag/Given 10/12/24 1636)  morphine  (PF) 4 MG/ML injection 4 mg (4 mg Intravenous Given 10/12/24 1636)  iohexol  (OMNIPAQUE ) 300 MG/ML solution 100 mL (100 mLs Intravenous Contrast Given 10/12/24 1651)  morphine  (PF) 4 MG/ML injection 4 mg (4 mg Intravenous Given 10/12/24 1936)  pantoprazole  (PROTONIX ) injection 40 mg (40 mg Intravenous Given 10/12/24 1938)     IMPRESSION / MDM / ASSESSMENT AND PLAN / ED COURSE  I reviewed the triage vital signs and the nursing notes.                              Differential diagnosis includes, but is not limited to, pancreatitis, biliary colic, cholecystitis, gastritis, GERD, colitis, diverticulitis, IBS, IBD.  Will get labs, CT, ultrasound, IV fluids, IV morphine .  Patient states he is not nauseous at this time.  Patient's presentation is most consistent with acute presentation with potential threat to life or bodily function.  Independent interpretation of labs and imaging below.  Clinical course as below.  Given his pancreatitis, he will need to be admitted for further management.  Consulted hospitalist who will admit the patient.  He is admitted.    Clinical Course as of 10/12/24 2117  Tue Oct 12, 2024  1616 Independent review of labs, he is hyponatremic, suspect could be dehydration given the decreased p.o. intake for last several days as well as mildly dry mucous membranes, creatinine is normal, T. bili is mildly elevated, rest of LFTs are normal, lipase is elevated, no leukocytosis. [TT]  1816 US  ABDOMEN LIMITED RUQ (LIVER/GB) IMPRESSION: 1. Moderate volume layering biliary sludge in the gallbladder. No cholecystolithiasis or sonographic changes of acute cholecystitis.   [TT]  1916 CT ABDOMEN PELVIS W CONTRAST IMPRESSION: 1. Moderate to marked severity acute on chronic  pancreatitis. 2. Stable area of soft tissue attenuation extending from the region of the ampulla into the lumen of the proximal duodenum. Correlation with endoscopy is recommended to exclude the presence of a small neoplasm. 3. Moderate to marked severity gastritis. 4. Small amount of posterior pelvic free fluid. 5. Aortic atherosclerosis.   [TT]  1949 Did call radiology to request for MRI read. [TT]  2028 Received preliminary read from radiology, no CBD dilation, no choledocholithiasis.  Do see pancreatitis. [TT]    Clinical Course User Index [TT] Waymond Chock  Jerri, MD     FINAL CLINICAL IMPRESSION(S) / ED DIAGNOSES   Final diagnoses:  Abdominal pain of multiple sites  Nausea and vomiting, unspecified vomiting type  Gastritis without bleeding, unspecified chronicity, unspecified gastritis type  Acute pancreatitis, unspecified complication status, unspecified pancreatitis type  Biliary sludge     Rx / DC Orders   ED Discharge Orders     None        Note:  This document was prepared using Dragon voice recognition software and may include unintentional dictation errors.    Waymond Lorelle Jerri, MD 10/12/24 2117
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
## 2024-10-12 NOTE — ED Notes (Signed)
 See triage note  Presents with abd pain  States pain started 4 days ago  Became worse today

## 2024-10-12 NOTE — ED Triage Notes (Signed)
 First Nurse Note:  Pt via [facility] from home. Pt c/o RUQ pain for the past 4 days, loss of appetite. Pt is A&Ox4 and NAD  EMS reports 113 HR, 98% on RA, 154/95 BP

## 2024-10-13 DIAGNOSIS — I2510 Atherosclerotic heart disease of native coronary artery without angina pectoris: Secondary | ICD-10-CM | POA: Diagnosis present

## 2024-10-13 DIAGNOSIS — E66813 Obesity, class 3: Secondary | ICD-10-CM | POA: Diagnosis present

## 2024-10-13 DIAGNOSIS — E860 Dehydration: Secondary | ICD-10-CM | POA: Diagnosis present

## 2024-10-13 DIAGNOSIS — K2970 Gastritis, unspecified, without bleeding: Secondary | ICD-10-CM | POA: Diagnosis present

## 2024-10-13 DIAGNOSIS — E871 Hypo-osmolality and hyponatremia: Secondary | ICD-10-CM | POA: Diagnosis present

## 2024-10-13 DIAGNOSIS — Z79899 Other long term (current) drug therapy: Secondary | ICD-10-CM | POA: Diagnosis not present

## 2024-10-13 DIAGNOSIS — Z89612 Acquired absence of left leg above knee: Secondary | ICD-10-CM | POA: Diagnosis not present

## 2024-10-13 DIAGNOSIS — E1122 Type 2 diabetes mellitus with diabetic chronic kidney disease: Secondary | ICD-10-CM | POA: Diagnosis present

## 2024-10-13 DIAGNOSIS — E785 Hyperlipidemia, unspecified: Secondary | ICD-10-CM | POA: Diagnosis present

## 2024-10-13 DIAGNOSIS — I129 Hypertensive chronic kidney disease with stage 1 through stage 4 chronic kidney disease, or unspecified chronic kidney disease: Secondary | ICD-10-CM | POA: Diagnosis present

## 2024-10-13 DIAGNOSIS — E1142 Type 2 diabetes mellitus with diabetic polyneuropathy: Secondary | ICD-10-CM | POA: Diagnosis present

## 2024-10-13 DIAGNOSIS — Z89611 Acquired absence of right leg above knee: Secondary | ICD-10-CM | POA: Diagnosis not present

## 2024-10-13 DIAGNOSIS — I480 Paroxysmal atrial fibrillation: Secondary | ICD-10-CM | POA: Diagnosis present

## 2024-10-13 DIAGNOSIS — Z794 Long term (current) use of insulin: Secondary | ICD-10-CM | POA: Diagnosis not present

## 2024-10-13 DIAGNOSIS — K838 Other specified diseases of biliary tract: Secondary | ICD-10-CM | POA: Diagnosis present

## 2024-10-13 DIAGNOSIS — N189 Chronic kidney disease, unspecified: Secondary | ICD-10-CM | POA: Diagnosis present

## 2024-10-13 DIAGNOSIS — Z7985 Long-term (current) use of injectable non-insulin antidiabetic drugs: Secondary | ICD-10-CM | POA: Diagnosis not present

## 2024-10-13 DIAGNOSIS — K859 Acute pancreatitis without necrosis or infection, unspecified: Secondary | ICD-10-CM | POA: Diagnosis present

## 2024-10-13 DIAGNOSIS — F17210 Nicotine dependence, cigarettes, uncomplicated: Secondary | ICD-10-CM | POA: Diagnosis present

## 2024-10-13 DIAGNOSIS — J449 Chronic obstructive pulmonary disease, unspecified: Secondary | ICD-10-CM | POA: Diagnosis present

## 2024-10-13 DIAGNOSIS — F1010 Alcohol abuse, uncomplicated: Secondary | ICD-10-CM | POA: Diagnosis present

## 2024-10-13 DIAGNOSIS — K861 Other chronic pancreatitis: Secondary | ICD-10-CM | POA: Diagnosis present

## 2024-10-13 DIAGNOSIS — Z6841 Body Mass Index (BMI) 40.0 and over, adult: Secondary | ICD-10-CM | POA: Diagnosis not present

## 2024-10-13 DIAGNOSIS — N400 Enlarged prostate without lower urinary tract symptoms: Secondary | ICD-10-CM | POA: Diagnosis present

## 2024-10-13 LAB — SODIUM, URINE, RANDOM: Sodium, Ur: <30 mmol/L

## 2024-10-13 LAB — BASIC METABOLIC PANEL WITH GFR
Anion gap: 15 (ref 5–15)
Anion gap: 13 (ref 5–15)
Anion gap: 11 (ref 5–15)
BUN: 14 mg/dL (ref 8–23)
BUN: 14 mg/dL (ref 8–23)
BUN: 13 mg/dL (ref 8–23)
CO2: 19 mmol/L — ABNORMAL LOW (ref 22–32)
CO2: 21 mmol/L — ABNORMAL LOW (ref 22–32)
CO2: 19 mmol/L — ABNORMAL LOW (ref 22–32)
Calcium: 8.4 mg/dL — ABNORMAL LOW (ref 8.9–10.3)
Calcium: 8.5 mg/dL — ABNORMAL LOW (ref 8.9–10.3)
Calcium: 8 mg/dL — ABNORMAL LOW (ref 8.9–10.3)
Chloride: 96 mmol/L — ABNORMAL LOW (ref 98–111)
Chloride: 98 mmol/L (ref 98–111)
Chloride: 100 mmol/L (ref 98–111)
Creatinine, Ser: 0.89 mg/dL (ref 0.61–1.24)
Creatinine, Ser: 0.88 mg/dL (ref 0.61–1.24)
Creatinine, Ser: 0.8 mg/dL (ref 0.61–1.24)
GFR, Estimated: >60 mL/min
GFR, Estimated: >60 mL/min
GFR, Estimated: >60 mL/min
Glucose, Bld: 101 mg/dL — ABNORMAL HIGH (ref 70–99)
Glucose, Bld: 122 mg/dL — ABNORMAL HIGH (ref 70–99)
Glucose, Bld: 232 mg/dL — ABNORMAL HIGH (ref 70–99)
Potassium: 4.3 mmol/L (ref 3.5–5.1)
Potassium: 4.5 mmol/L (ref 3.5–5.1)
Potassium: 4.4 mmol/L (ref 3.5–5.1)
Sodium: 130 mmol/L — ABNORMAL LOW (ref 135–145)
Sodium: 131 mmol/L — ABNORMAL LOW (ref 135–145)
Sodium: 130 mmol/L — ABNORMAL LOW (ref 135–145)

## 2024-10-13 LAB — CBC
HCT: 38.9 % — ABNORMAL LOW (ref 39.0–52.0)
Hemoglobin: 13 g/dL (ref 13.0–17.0)
MCH: 28.4 pg (ref 26.0–34.0)
MCHC: 33.4 g/dL (ref 30.0–36.0)
MCV: 84.9 fL (ref 80.0–100.0)
Platelets: 191 K/uL (ref 150–400)
RBC: 4.58 MIL/uL (ref 4.22–5.81)
RDW: 15.5 % (ref 11.5–15.5)
WBC: 6.5 K/uL (ref 4.0–10.5)
nRBC: 0 % (ref 0.0–0.2)

## 2024-10-13 LAB — LIPASE, BLOOD: Lipase: 517 U/L — ABNORMAL HIGH (ref 11–51)

## 2024-10-13 LAB — OSMOLALITY, URINE: Osmolality, Ur: 314 mosm/kg (ref 300–900)

## 2024-10-13 LAB — HEMOGLOBIN A1C
Hgb A1c MFr Bld: 7.1 % — ABNORMAL HIGH (ref 4.8–5.6)
Mean Plasma Glucose: 157.07 mg/dL

## 2024-10-13 LAB — COMPREHENSIVE METABOLIC PANEL WITH GFR
ALT: 5 U/L (ref 0–44)
AST: 13 U/L — ABNORMAL LOW (ref 15–41)
Albumin: 3.1 g/dL — ABNORMAL LOW (ref 3.5–5.0)
Alkaline Phosphatase: 91 U/L (ref 38–126)
Anion gap: 14 (ref 5–15)
BUN: 15 mg/dL (ref 8–23)
CO2: 20 mmol/L — ABNORMAL LOW (ref 22–32)
Calcium: 8.4 mg/dL — ABNORMAL LOW (ref 8.9–10.3)
Chloride: 95 mmol/L — ABNORMAL LOW (ref 98–111)
Creatinine, Ser: 0.89 mg/dL (ref 0.61–1.24)
GFR, Estimated: >60 mL/min
Glucose, Bld: 93 mg/dL (ref 70–99)
Potassium: 4.3 mmol/L (ref 3.5–5.1)
Sodium: 129 mmol/L — ABNORMAL LOW (ref 135–145)
Total Bilirubin: 0.9 mg/dL (ref 0.0–1.2)
Total Protein: 6.6 g/dL (ref 6.5–8.1)

## 2024-10-13 LAB — GLUCOSE, CAPILLARY
Glucose-Capillary: 97 mg/dL (ref 70–99)
Glucose-Capillary: 86 mg/dL (ref 70–99)
Glucose-Capillary: 181 mg/dL — ABNORMAL HIGH (ref 70–99)

## 2024-10-13 MED ORDER — ENOXAPARIN SODIUM 40 MG/0.4ML IJ SOSY
40.0000 mg | PREFILLED_SYRINGE | INTRAMUSCULAR | Status: DC
  Administered 2024-10-13 – 2024-10-15 (×3): 40 mg via SUBCUTANEOUS
  Filled 2024-10-13 (×3): qty 0.4

## 2024-10-13 NOTE — Progress Notes (Signed)
 Patient admitted to room 209 from ED for observation d/t pancreatitis. MRCP pending per notes. A+Ox4. VSS. NPO except sips with meds. Observing pain management, fall precautions and administering antibiotic therapy. Patient's needs and call bell within reach. Bed locked and set to lowest position with alarm on. Will continue to monitor and assess with plan of care.

## 2024-10-13 NOTE — Progress Notes (Signed)
 " Progress Note   Patient: Vincent [name] FMW:[id number] DOB: [birth year] DOA: 10/12/2024     0 DOS: the patient was seen and examined on 10/13/2024   Brief hospital course: From HPI Vincent [name] is a 63 y.o. male with medical history significant of tobacco abuse, alcohol abuse, pancreatitis, hypertension, hyperlipidemia, DM, gout, BPH, PAF not on anticoagulants, obesity, hepatitis C, hyponatremia, s/p of bilateral AKA, who presents with abdominal pain.   Patient was recently hospitalized from 11/21 - 11/26 due to pancreatitis.  Patient was instructed to stop taking Ozempic.  He states that he has abdominal pain in the past 4 days, which has worsened today.  The abdominal pain is diffuse, worse in the epigastric area, constant, aching, severe, nonradiating, not aggravated or alleviated by any known factors.  Patient has nausea and vomited few times with nonbilious nonbloody vomiting.  No diarrhea.  No fever or chills.  Denies chest pain, cough, SOB.  No symptoms of UTI. Patient states that he drinks 1 beer occasionally, last drinking was 3 weeks ago.     Data reviewed independently and ED Course: pt was found to have lipase 933, triglyceride 85, Mg 1.4, liver function (ALP 133, AST 17, ALT 6, total bilirubin 1.3), WBC 7.4, GFR> 60, sodium 126, negative UA.  Temperature 99.1, blood pressure 112/68, heart rate 90-1 100s, RR 20, oxygen saturation 96% on room air.  Patient is placed in telemetry bed of observation.    Assessment and Plan:   Acute on chronic pancreatitis (HCC): Lipase 933.  Triglyceride level normal 85.  Total bilirubin is slightly elevated 1.3. RUQ-US  showed moderate volume layering biliary sludge in the gallbladder. MRCP reviewed did not show obvious biliary system obstruction.  Did show some fluid-filled cyst Diet advanced today Continue as needed Dilaudid , Percocet, Tylenol  for pain Continue as needed Zofran  for nausea vomiting Continue IV fluid    Abnormal findings of CT  abdomen/pelvis: CT scan showed stable area of soft tissue attenuation extending from the region of the ampulla into the lumen of the proximal duodenum. Correlation with endoscopy is recommended to exclude the presence of a small neoplasm. MRCP did not show any pancreatic mass   Type 2 diabetes mellitus with peripheral neuropathy [facility]): Recent A1c 6.7, well-controlled.   Continue insulin  therapy Monitor glucose closely  HTN (hypertension): Patient is not taking medications currently.  Blood pressure 112/68. -IV hydralazine  as needed   PAF (paroxysmal atrial fibrillation) (HCC): Patient does not take anticoagulants.  Heart rate 90-100s. -As needed metoprolol  2.5 mg every 2 hour for heart rate> 125   Hyponatremia: Sodium 126.  Mental status normal. Likely due to poor oral intake and dehydration. Follow-up check urine sodium, urine osmolality, serum osmolality. Continue on IV fluid   Hypomagnesemia:  On presentation magnesium  1.4.  Potassium 4.7 and phosphorus 3.3.  Continue repletion and monitoring   COPD (chronic obstructive pulmonary disease) (HCC): Stable -As needed albuterol  and Mucinex    S/P AKA (above knee amputation) bilateral (HCC) -Precaution   Gastritis -Protonix    Tobacco abuse -Nicotine  patch - Did counseling about importance of quitting smoking   Alcohol use: Patient states that her last drink was 3 weeks ago.  Currently no signs of withdrawal. -Observe closely for any signs of withdrawal Continue folic acid  and vitamin B1   Obesity, Class III, BMI 40.5 Counseled on weight loss when medically stable  DVT ppx:  sQ Lovenox    Code Status: Full code    Family Communication:     not done,  no family member is at bed side.      Disposition Plan:  Anticipate discharge back to previous environment   Consults called:  none  Subjective:  Seen and examined at bedside this morning Denies nausea vomiting  chest pain cough Abdominal pain is improving Diet being  advanced today  Physical Exam: General: Not in acute distress Heme: No neck lymph node enlargement. Cardiac: S1/S2, RRR, No murmurs, No gallops or rubs. Respiratory: No rales, wheezing, rhonchi or rubs. GI: Soft, tenderness noted to the mid abdomen GU: No hematuria Ext: No pitting leg edema bilaterally. S/p of bilateral AKA Musculoskeletal: No joint deformities, No joint redness or warmth, no limitation of ROM in spin. Skin: No rashes.  Neuro: Alert, oriented X3, cranial nerves II-XII grossly intact.  Psych: Patient is not psychotic, no suicidal or hemocidal ideation.  Vitals:   10/12/24 2149 10/13/24 0426 10/13/24 0744 10/13/24 1423  BP: 128/77 106/64 125/67 130/70  Pulse: 88 83 74 (!) 102  Resp: 17 18 18 18   Temp: 98.7 F (37.1 C) (!) 97.5 F (36.4 C) 97.7 F (36.5 C) 98.4 F (36.9 C)  TempSrc:  Oral    SpO2: 98% 100% 100% 100%  Weight:      Height:        Data Reviewed: MRCP and abdominal imaging reviewed as noted above    Latest Ref Rng & Units 10/13/2024    4:45 AM 10/12/2024   11:00 AM 09/11/2024    5:55 AM  CBC  WBC 4.0 - 10.5 K/uL 6.5  7.4  6.4   Hemoglobin 13.0 - 17.0 g/dL 86.9  85.0  86.7   Hematocrit 39.0 - 52.0 % 38.9  43.7  39.3   Platelets 150 - 400 K/uL 191  219  179        Latest Ref Rng & Units 10/13/2024   12:27 PM 10/13/2024    9:16 AM 10/13/2024    4:45 AM  BMP  Glucose 70 - 99 mg/dL 877  898  93   BUN 8 - 23 mg/dL 14  14  15    Creatinine 0.61 - 1.24 mg/dL 9.11  9.10  9.10   Sodium 135 - 145 mmol/L 131  130  129   Potassium 3.5 - 5.1 mmol/L 4.5  4.3  4.3   Chloride 98 - 111 mmol/L 98  96  95   CO2 22 - 32 mmol/L 21  19  20    Calcium  8.9 - 10.3 mg/dL 8.5  8.4  8.4     Author: Drue ONEIDA Potter, MD 10/13/2024 4:41 PM  For on call review [email or web address].  "

## 2024-10-13 NOTE — Plan of Care (Signed)

## 2024-10-14 DIAGNOSIS — K859 Acute pancreatitis without necrosis or infection, unspecified: Secondary | ICD-10-CM | POA: Diagnosis not present

## 2024-10-14 DIAGNOSIS — K861 Other chronic pancreatitis: Secondary | ICD-10-CM | POA: Diagnosis not present

## 2024-10-14 LAB — CBC WITH DIFFERENTIAL/PLATELET
Abs Immature Granulocytes: 0.04 K/uL (ref 0.00–0.07)
Basophils Absolute: 0.1 K/uL (ref 0.0–0.1)
Basophils Relative: 1 %
Eosinophils Absolute: 0.2 K/uL (ref 0.0–0.5)
Eosinophils Relative: 3 %
HCT: 34.3 % — ABNORMAL LOW (ref 39.0–52.0)
Hemoglobin: 11.6 g/dL — ABNORMAL LOW (ref 13.0–17.0)
Immature Granulocytes: 1 %
Lymphocytes Relative: 37 %
Lymphs Abs: 2.6 K/uL (ref 0.7–4.0)
MCH: 28.7 pg (ref 26.0–34.0)
MCHC: 33.8 g/dL (ref 30.0–36.0)
MCV: 84.9 fL (ref 80.0–100.0)
Monocytes Absolute: 0.7 K/uL (ref 0.1–1.0)
Monocytes Relative: 10 %
Neutro Abs: 3.3 K/uL (ref 1.7–7.7)
Neutrophils Relative %: 48 %
Platelets: 180 K/uL (ref 150–400)
RBC: 4.04 MIL/uL — ABNORMAL LOW (ref 4.22–5.81)
RDW: 15.4 % (ref 11.5–15.5)
WBC: 6.9 K/uL (ref 4.0–10.5)
nRBC: 0 % (ref 0.0–0.2)

## 2024-10-14 LAB — GLUCOSE, CAPILLARY
Glucose-Capillary: 94 mg/dL (ref 70–99)
Glucose-Capillary: 186 mg/dL — ABNORMAL HIGH (ref 70–99)
Glucose-Capillary: 90 mg/dL (ref 70–99)
Glucose-Capillary: 161 mg/dL — ABNORMAL HIGH (ref 70–99)

## 2024-10-14 LAB — BASIC METABOLIC PANEL WITH GFR
Anion gap: 10 (ref 5–15)
BUN: 9 mg/dL (ref 8–23)
CO2: 22 mmol/L (ref 22–32)
Calcium: 8 mg/dL — ABNORMAL LOW (ref 8.9–10.3)
Chloride: 101 mmol/L (ref 98–111)
Creatinine, Ser: 0.67 mg/dL (ref 0.61–1.24)
GFR, Estimated: >60 mL/min
Glucose, Bld: 124 mg/dL — ABNORMAL HIGH (ref 70–99)
Potassium: 4.2 mmol/L (ref 3.5–5.1)
Sodium: 132 mmol/L — ABNORMAL LOW (ref 135–145)

## 2024-10-14 NOTE — Plan of Care (Signed)

## 2024-10-14 NOTE — Progress Notes (Signed)
 " Progress Note   Patient: Vincent [name] FMW:[id number] DOB: 20-Jun-1961 DOA: 10/12/2024     1 DOS: the patient was seen and examined on 10/14/2024    Brief hospital course: From HPI Vincent [name] is a 63 y.o. male with medical history significant of tobacco abuse, alcohol abuse, pancreatitis, hypertension, hyperlipidemia, DM, gout, BPH, PAF not on anticoagulants, obesity, hepatitis C, hyponatremia, s/p of bilateral AKA, who presents with abdominal pain.   Patient was recently hospitalized from 11/21 - 11/26 due to pancreatitis.  Patient was instructed to stop taking Ozempic.  He states that he has abdominal pain in the past 4 days, which has worsened today.  The abdominal pain is diffuse, worse in the epigastric area, constant, aching, severe, nonradiating, not aggravated or alleviated by any known factors.  Patient has nausea and vomited few times with nonbilious nonbloody vomiting.  No diarrhea.  No fever or chills.  Denies chest pain, cough, SOB.  No symptoms of UTI. Patient states that he drinks 1 beer occasionally, last drinking was 3 weeks ago.     Data reviewed independently and ED Course: pt was found to have lipase 933, triglyceride 85, Mg 1.4, liver function (ALP 133, AST 17, ALT 6, total bilirubin 1.3), WBC 7.4, GFR> 60, sodium 126, negative UA.  Temperature 99.1, blood pressure 112/68, heart rate 90-1 100s, RR 20, oxygen saturation 96% on room air.  Patient is placed in telemetry bed of observation.     Assessment and Plan:    Acute on chronic pancreatitis (HCC): Lipase 933.  Triglyceride level normal 85.  Total bilirubin is slightly elevated 1.3. RUQ-US  showed moderate volume layering biliary sludge in the gallbladder. MRCP reviewed did not show obvious biliary system obstruction.  Did show some fluid-filled cyst Diet advanced today Continue as needed Dilaudid , Percocet, Tylenol  for pain Continue as needed Zofran  for nausea vomiting Continue IV fluid     Abnormal findings  of CT abdomen/pelvis: CT scan showed stable area of soft tissue attenuation extending from the region of the ampulla into the lumen of the proximal duodenum. Correlation with endoscopy is recommended to exclude the presence of a small neoplasm. MRCP did not show any pancreatic mass   Type 2 diabetes mellitus with peripheral neuropathy [facility]): Recent A1c 6.7, well-controlled.   Continue insulin  therapy Monitor glucose closely   HTN (hypertension): Patient is not taking medications currently.  Blood pressure 112/68. -IV hydralazine  as needed   PAF (paroxysmal atrial fibrillation) (HCC): Patient does not take anticoagulants.  Heart rate 90-100s. -As needed metoprolol  2.5 mg every 2 hour for heart rate> 125   Hyponatremia: Sodium 126.  Mental status normal. Likely due to poor oral intake and dehydration. Follow-up check urine sodium, urine osmolality, serum osmolality. Continue on IV fluid   Hypomagnesemia:  On presentation magnesium  1.4.  Potassium 4.7 and phosphorus 3.3.  Continue repletion and monitoring   COPD (chronic obstructive pulmonary disease) (HCC): Stable -As needed albuterol  and Mucinex    S/P AKA (above knee amputation) bilateral (HCC) -Precaution   Gastritis -Protonix    Tobacco abuse -Nicotine  patch - Did counseling about importance of quitting smoking   Alcohol use: Patient states that her last drink was 3 weeks ago.  Currently no signs of withdrawal. -Observe closely for any signs of withdrawal Continue folic acid  and vitamin B1   Obesity, Class III, BMI 40.5 Counseled on weight loss when medically stable   DVT ppx:  sQ Lovenox    Code Status: Full code    Family Communication:  not done, no family member is at bed side.      Disposition Plan:  Anticipate discharge back to previous environment   Consults called:  none   Subjective:  Seen and examined at bedside this morning Denies nausea vomiting or chest pain Abdominal pain improving   Physical  Exam: General: Not in acute distress Heme: No neck lymph node enlargement. Cardiac: S1/S2, RRR, No murmurs, No gallops or rubs. Respiratory: No rales, wheezing, rhonchi or rubs. GI: Soft, tenderness noted to the mid abdomen GU: No hematuria Ext: No pitting leg edema bilaterally. S/p of bilateral AKA Musculoskeletal: No joint deformities, No joint redness or warmth, no limitation of ROM in spin. Skin: No rashes.  Neuro: Alert, oriented X3, cranial nerves II-XII grossly intact.  Psych: Patient is not psychotic, no suicidal or hemocidal ideation.    Vitals:   10/14/24 0422 10/14/24 0726 10/14/24 1137 10/14/24 1644  BP: 135/74 (!) 169/80 133/77 (!) 141/78  Pulse: 75 77  95  Resp: 18 18  16   Temp: 98.4 F (36.9 C) 97.6 F (36.4 C)  99.5 F (37.5 C)  TempSrc:  Oral  Oral  SpO2: 100% 97%  100%  Weight:      Height:        Data Reviewed:    Latest Ref Rng & Units 10/14/2024    4:34 AM 10/13/2024    4:45 AM 10/12/2024   11:00 AM  CBC  WBC 4.0 - 10.5 K/uL 6.9  6.5  7.4   Hemoglobin 13.0 - 17.0 g/dL 88.3  86.9  85.0   Hematocrit 39.0 - 52.0 % 34.3  38.9  43.7   Platelets 150 - 400 K/uL 180  191  219        Latest Ref Rng & Units 10/14/2024    4:34 AM 10/13/2024    4:38 PM 10/13/2024   12:27 PM  BMP  Glucose 70 - 99 mg/dL 875  767  877   BUN 8 - 23 mg/dL 9  13  14    Creatinine 0.61 - 1.24 mg/dL 9.32  9.19  9.11   Sodium 135 - 145 mmol/L 132  130  131   Potassium 3.5 - 5.1 mmol/L 4.2  4.4  4.5   Chloride 98 - 111 mmol/L 101  100  98   CO2 22 - 32 mmol/L 22  19  21    Calcium  8.9 - 10.3 mg/dL 8.0  8.0  8.5     Author: Drue ONEIDA Potter, MD 10/14/2024 4:49 PM  For on call review [email or web address].  "

## 2024-10-15 DIAGNOSIS — K859 Acute pancreatitis without necrosis or infection, unspecified: Secondary | ICD-10-CM | POA: Diagnosis not present

## 2024-10-15 DIAGNOSIS — K861 Other chronic pancreatitis: Secondary | ICD-10-CM | POA: Diagnosis not present

## 2024-10-15 LAB — GLUCOSE, CAPILLARY
Glucose-Capillary: 121 mg/dL — ABNORMAL HIGH (ref 70–99)
Glucose-Capillary: 144 mg/dL — ABNORMAL HIGH (ref 70–99)
Glucose-Capillary: 70 mg/dL (ref 70–99)
Glucose-Capillary: 95 mg/dL (ref 70–99)

## 2024-10-15 MED ORDER — ENSURE PLUS HIGH PROTEIN PO LIQD
237.0000 mL | Freq: Three times a day (TID) | ORAL | Status: DC
  Administered 2024-10-15: 237 mL via ORAL

## 2024-10-15 MED ORDER — ADULT MULTIVITAMIN W/MINERALS CH
1.0000 | ORAL_TABLET | Freq: Every day | ORAL | Status: DC
  Administered 2024-10-16: 1 via ORAL
  Filled 2024-10-15: qty 1

## 2024-10-15 NOTE — Progress Notes (Signed)
 Initial Nutrition Assessment  DOCUMENTATION CODES:   Not applicable  INTERVENTION:   Ensure Plus High Protein po TID, each supplement provides 350 kcal and 20 grams of protein  MVI, folic acid  and thiamine  po daily  Pt at high refeed risk; recommend monitor potassium, magnesium  and phosphorus labs daily until stable  Daily weights   NUTRITION DIAGNOSIS:   Inadequate oral intake related to acute illness as evidenced by per patient/family report.  GOAL:   Patient will meet greater than or equal to 90% of their needs  MONITOR:   PO intake, Supplement acceptance, Labs, Weight trends, I & O's, Skin  REASON FOR ASSESSMENT:   Malnutrition Screening Tool    ASSESSMENT:   63 y/o male with h/o etoh abuse, DM, HTN, PAF, COPD, b/l AKAs, HCV, Afib, BPH, HLD and recent admission from 11/21-11/26 for pancreatitis and who is now admitted with acute on chronic pancreatitis.  RD working remotely.  Pt reports nausea, vomiting and abdominal pain for 4 days pta. Pt also reported decreased appetite and oral intake. RN reports that patient is eating fairly well in hospital. RD will add supplements to help pt meet his estimated needs. Pt is at high refeed risk. Per chart, pt is down ~70lbs over the past two years; pt s/p bilateral AKAs so unsure how much weight loss is significant. RD will obtain exam and history at follow up.   Medications reviewed and include: lovenox , folic acid , insulin , nicotine , protonix , NaCl tabs, thiamine , NaCl @100ml /hr  Labs reviewed: Na 132(L), K 4.2 wnl Cbgs- 144, 121, 161 x 24 hrs  AIC 7.1(H)- 12/23  UOP-   NUTRITION - FOCUSED PHYSICAL EXAM: Unable to perform at this time   Diet Order:   Diet Order             DIET SOFT Room service appropriate? Yes; Fluid consistency: Thin  Diet effective now                  EDUCATION NEEDS:   No education needs have been identified at this time  Skin:  Skin Assessment: Reviewed RN Assessment  Last  BM:  12/22- constipation  Height:   Ht Readings from Last 1 Encounters:  10/15/24 6' 5 (1.956 m)    Weight:   Wt Readings from Last 1 Encounters:  10/12/24 68 kg    Ideal Body Weight:  71.8 kg (adjusted for AKAs)  BMI:  Body mass index is 17.79 kg/m.  Estimated Nutritional Needs:   Kcal:  1900-2200kcal/day  Protein:  95-110g/day  Fluid:  2.0-2.3L/day  Augustin Shams MS, RD, LDN If unable to be reached, please send secure chat to RD inpatient available from 8:00a-4:00p daily

## 2024-10-15 NOTE — TOC CM/SW Note (Signed)
 Transition of Care [facility]) CM/SW Note    Transition of Care [facility]) - Inpatient Brief Assessment   Patient Details  Name: Vincent [name] MRN: [id number] Date of Birth: 1961/06/04  Transition of Care [facility]) CM/SW Contact:    Alfonso Rummer, LCSW Phone Number: 10/15/2024, 1:43 PM   Clinical Narrative: Completed toc chart review no toc needs at this time. Please contact toc should needs arise.    Transition of Care Asessment: Insurance and Status: Insurance coverage has been reviewed Patient has primary care physician: Yes [facility]ENT[facility], [facility]) Home environment has been reviewed: single family home   Prior/Current Home Services: No current home services Social Drivers of Health Review: SDOH reviewed no interventions necessary Readmission risk has been reviewed: No Transition of care needs: no transition of care needs at this time

## 2024-10-15 NOTE — Progress Notes (Addendum)
 " Progress Note   Patient: Vincent [name] FMW:[id number] DOB: Nov 12, 1960 DOA: 10/12/2024     2 DOS: the patient was seen and examined on 10/15/2024   Brief hospital course: From HPI Vincent [name] is a 63 y.o. male with medical history significant of tobacco abuse, alcohol abuse, pancreatitis, hypertension, hyperlipidemia, DM, gout, BPH, PAF not on anticoagulants, obesity, hepatitis C, hyponatremia, s/p of bilateral AKA, who presents with abdominal pain.   Patient was recently hospitalized from 11/21 - 11/26 due to pancreatitis.  Patient was instructed to stop taking Ozempic.  He states that he has abdominal pain in the past 4 days, which has worsened today.  The abdominal pain is diffuse, worse in the epigastric area, constant, aching, severe, nonradiating, not aggravated or alleviated by any known factors.  Patient has nausea and vomited few times with nonbilious nonbloody vomiting.  No diarrhea.  No fever or chills.  Denies chest pain, cough, SOB.  No symptoms of UTI. Patient states that he drinks 1 beer occasionally, last drinking was 3 weeks ago.     Data reviewed independently and ED Course: pt was found to have lipase 933, triglyceride 85, Mg 1.4, liver function (ALP 133, AST 17, ALT 6, total bilirubin 1.3), WBC 7.4, GFR> 60, sodium 126, negative UA.  Temperature 99.1, blood pressure 112/68, heart rate 90-1 100s, RR 20, oxygen saturation 96% on room air.  Patient is placed in telemetry bed of observation.     Assessment and Plan:    Acute on chronic pancreatitis (HCC): Lipase 933.   This is likely due to Ozempic  triglyceride level normal 85.  Total bilirubin is slightly elevated 1.3. RUQ-US  showed moderate volume layering biliary sludge in the gallbladder. MRCP reviewed did not show obvious biliary system obstruction.  Did show some fluid-filled cyst Continue current soft diet Continue as needed Dilaudid , Percocet, Tylenol  for pain Continue as needed Zofran  for nausea  vomiting Continue IV fluid Patient counseled about discussion with his PCP concerning Ozempic use     Abnormal findings of CT abdomen/pelvis: CT scan showed stable area of soft tissue attenuation extending from the region of the ampulla into the lumen of the proximal duodenum. Correlation with endoscopy is recommended to exclude the presence of a small neoplasm. MRCP did not show any pancreatic mass   Type 2 diabetes mellitus with peripheral neuropathy [facility]): Recent A1c 6.7, well-controlled.   Continue insulin  therapy Monitor glucose closely   HTN (hypertension): Patient is not taking medications currently.  Blood pressure 112/68. -IV hydralazine  as needed   PAF (paroxysmal atrial fibrillation) (HCC): Patient does not take anticoagulants.  Heart rate 90-100s. -As needed metoprolol  2.5 mg every 2 hour for heart rate> 125   Hyponatremia: Sodium 126.  Mental status normal. Likely due to poor oral intake and dehydration. Follow-up check urine sodium, urine osmolality, serum osmolality. Continue on IV fluid   Hypomagnesemia:  On presentation magnesium  1.4.  Potassium 4.7 and phosphorus 3.3.  Continue repletion and monitoring   COPD (chronic obstructive pulmonary disease) (HCC): Stable -As needed albuterol  and Mucinex    S/P AKA (above knee amputation) bilateral (HCC) -Precaution   Gastritis -Protonix    Tobacco abuse -Nicotine  patch - Did counseling about importance of quitting smoking   Alcohol use: Patient states that her last drink was 3 weeks ago.  Currently no signs of withdrawal. 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      Disposition Plan:  Anticipate discharge back to previous environment   Consults called:  none    Subjective:  Seen and examined at bedside this morning He is still having abdominal pain already improving He would like to continue with soft diet   Physical Exam: General: Not in acute distress Heme: No neck lymph node enlargement. Cardiac: S1/S2, RRR, No murmurs, No gallops or rubs. Respiratory: No rales, wheezing, rhonchi or rubs. GI: Soft, tenderness noted to the mid abdomen GU: No hematuria Ext: No pitting leg edema bilaterally. S/p of bilateral AKA Musculoskeletal: No joint deformities, No joint redness or warmth, no limitation of ROM in spin. Skin: No rashes.  Neuro: Alert, oriented X3, cranial nerves II-XII grossly intact.  Psych: Patient is not psychotic, no suicidal or hemocidal ideation.     Data Reviewed:     Latest Ref Rng & Units 10/14/2024    4:34 AM 10/13/2024    4:45 AM 10/12/2024   11:00 AM  CBC  WBC 4.0 - 10.5 K/uL 6.9  6.5  7.4   Hemoglobin 13.0 - 17.0 g/dL 88.3  86.9  85.0   Hematocrit 39.0 - 52.0 % 34.3  38.9  43.7   Platelets 150 - 400 K/uL 180  191  219        Latest Ref Rng & Units 10/14/2024    4:34 AM 10/13/2024    4:38 PM 10/13/2024   12:27 PM  BMP  Glucose 70 - 99 mg/dL 875  767  877   BUN 8 - 23 mg/dL 9  13  14    Creatinine 0.61 - 1.24 mg/dL 9.32  9.19  9.11   Sodium 135 - 145 mmol/L 132  130  131   Potassium 3.5 - 5.1 mmol/L 4.2  4.4  4.5   Chloride 98 - 111 mmol/L 101  100  98   CO2 22 - 32 mmol/L 22  19  21    Calcium  8.9 - 10.3 mg/dL 8.0  8.0  8.5     Vitals:   10/15/24 0434 10/15/24 0747 10/15/24 1404 10/15/24 1522  BP: 121/75 119/70 134/65   Pulse: 88 86 87   Resp: 16 18 19    Temp: 99 F (37.2 C) 98.5 F (36.9 C) 98.2 F (36.8 C)   TempSrc: Oral  Oral   SpO2: 100% 99% 99%   Weight:      Height:    6' 5 (1.956 m)    Author: Drue ONEIDA Potter, MD 10/15/2024 5:42 PM  For on call review [email or web address].  "

## 2024-10-15 NOTE — Plan of Care (Signed)
" °  Problem: Education: Goal: Knowledge of General Education information will improve Description: Including pain rating scale, medication(s)/side effects and non-pharmacologic comfort measures Outcome: Progressing   Problem: Clinical Measurements: Goal: Ability to maintain clinical measurements within normal limits will improve Outcome: Progressing   Problem: Clinical Measurements: Goal: Will remain free from infection Outcome: Progressing   Problem: Clinical Measurements: Goal: Diagnostic test results will improve Outcome: Progressing   Problem: Nutrition: Goal: Adequate nutrition will be maintained Outcome: Progressing   Problem: Elimination: Goal: Will not experience complications related to bowel motility Outcome: Progressing   Problem: Elimination: Goal: Will not experience complications related to urinary retention Outcome: Progressing   Problem: Pain Managment: Goal: General experience of comfort will improve and/or be controlled Outcome: Progressing   "

## 2024-10-16 ENCOUNTER — Other Ambulatory Visit: Payer: Self-pay

## 2024-10-16 LAB — HEPATIC FUNCTION PANEL
ALT: 10 U/L (ref 0–44)
AST: 29 U/L (ref 15–41)
Albumin: 2.5 g/dL — ABNORMAL LOW (ref 3.5–5.0)
Alkaline Phosphatase: 89 U/L (ref 38–126)
Bilirubin, Direct: 0.4 mg/dL — ABNORMAL HIGH (ref 0.0–0.2)
Indirect Bilirubin: 0.3 mg/dL (ref 0.3–0.9)
Total Bilirubin: 0.6 mg/dL (ref 0.0–1.2)
Total Protein: 5.5 g/dL — ABNORMAL LOW (ref 6.5–8.1)

## 2024-10-16 LAB — CBC WITH DIFFERENTIAL/PLATELET
Abs Immature Granulocytes: 0.03 K/uL (ref 0.00–0.07)
Basophils Absolute: 0.1 K/uL (ref 0.0–0.1)
Basophils Relative: 1 %
Eosinophils Absolute: 0.3 K/uL (ref 0.0–0.5)
Eosinophils Relative: 4 %
HCT: 34 % — ABNORMAL LOW (ref 39.0–52.0)
Hemoglobin: 11.7 g/dL — ABNORMAL LOW (ref 13.0–17.0)
Immature Granulocytes: 0 %
Lymphocytes Relative: 28 %
Lymphs Abs: 2 K/uL (ref 0.7–4.0)
MCH: 29 pg (ref 26.0–34.0)
MCHC: 34.4 g/dL (ref 30.0–36.0)
MCV: 84.2 fL (ref 80.0–100.0)
Monocytes Absolute: 0.7 K/uL (ref 0.1–1.0)
Monocytes Relative: 10 %
Neutro Abs: 4.1 K/uL (ref 1.7–7.7)
Neutrophils Relative %: 57 %
Platelets: 222 K/uL (ref 150–400)
RBC: 4.04 MIL/uL — ABNORMAL LOW (ref 4.22–5.81)
RDW: 15.5 % (ref 11.5–15.5)
WBC: 7.2 K/uL (ref 4.0–10.5)
nRBC: 0 % (ref 0.0–0.2)

## 2024-10-16 LAB — BASIC METABOLIC PANEL WITH GFR
Anion gap: 10 (ref 5–15)
BUN: <5 mg/dL — ABNORMAL LOW (ref 8–23)
CO2: 22 mmol/L (ref 22–32)
Calcium: 7.7 mg/dL — ABNORMAL LOW (ref 8.9–10.3)
Chloride: 105 mmol/L (ref 98–111)
Creatinine, Ser: 0.58 mg/dL — ABNORMAL LOW (ref 0.61–1.24)
GFR, Estimated: >60 mL/min
Glucose, Bld: 149 mg/dL — ABNORMAL HIGH (ref 70–99)
Potassium: 3.9 mmol/L (ref 3.5–5.1)
Sodium: 136 mmol/L (ref 135–145)

## 2024-10-16 LAB — MAGNESIUM: Magnesium: 1.2 mg/dL — ABNORMAL LOW (ref 1.7–2.4)

## 2024-10-16 LAB — GLUCOSE, CAPILLARY: Glucose-Capillary: 149 mg/dL — ABNORMAL HIGH (ref 70–99)

## 2024-10-16 LAB — PHOSPHORUS: Phosphorus: 2.9 mg/dL (ref 2.5–4.6)

## 2024-10-16 MED ORDER — PANTOPRAZOLE SODIUM 40 MG PO TBEC
40.0000 mg | DELAYED_RELEASE_TABLET | Freq: Every day | ORAL | 1 refills | Status: AC
  Filled 2024-10-16: qty 30, 30d supply, fill #0

## 2024-10-16 MED ORDER — FOLIC ACID 1 MG PO TABS
1.0000 mg | ORAL_TABLET | Freq: Every day | ORAL | 0 refills | Status: AC
  Filled 2024-10-16: qty 30, 30d supply, fill #0

## 2024-10-16 MED ORDER — VITAMIN B-1 100 MG PO TABS
100.0000 mg | ORAL_TABLET | Freq: Every day | ORAL | 0 refills | Status: AC
  Filled 2024-10-16: qty 30, 30d supply, fill #0

## 2024-10-16 MED ORDER — SODIUM CHLORIDE 1 G PO TABS
1.0000 g | ORAL_TABLET | Freq: Two times a day (BID) | ORAL | 0 refills | Status: AC
  Filled 2024-10-16: qty 30, 15d supply, fill #0

## 2024-10-16 MED ORDER — SENNOSIDES-DOCUSATE SODIUM 8.6-50 MG PO TABS
1.0000 | ORAL_TABLET | Freq: Every evening | ORAL | 0 refills | Status: AC | PRN
  Filled 2024-10-16: qty 30, 30d supply, fill #0

## 2024-10-16 NOTE — Discharge Summary (Signed)
 " Physician Discharge Summary   Patient: Vincent [name] MRN: [id number] DOB: [birth year]  Admit date:     10/12/2024  Discharge date: 10/16/2024  Discharge Physician: Drue ONEIDA Potter   PCP: [facility]   Recommendations at discharge:  Follow-up with PCP  Discharge Diagnoses: Principal Problem:   Acute on chronic pancreatitis (HCC) Active Problems:   Type 2 diabetes mellitus with peripheral neuropathy (HCC)   HTN (hypertension)   PAF (paroxysmal atrial fibrillation) (HCC)   Hyponatremia   Hypomagnesemia   COPD (chronic obstructive pulmonary disease) (HCC)   S/P AKA (above knee amputation) bilateral (HCC)   Gastritis   Tobacco abuse   Alcohol use   Obesity, Class III, BMI 40-49.9 (morbid obesity) (HCC)  Resolved Problems:   * No resolved hospital problems. [facility] Course:  Brief hospital course: From HPI Vincent [name] is a 63 y.o. male with medical history significant of tobacco abuse, alcohol abuse, pancreatitis, hypertension, hyperlipidemia, DM, gout, BPH, PAF not on anticoagulants, obesity, hepatitis C, hyponatremia, s/p of bilateral AKA, who presents with abdominal pain.   Patient was recently hospitalized from 11/21 - 11/26 due to pancreatitis.  Patient was instructed to stop taking Ozempic.  He states that he has abdominal pain in the past 4 days, which has worsened today.  The abdominal pain is diffuse, worse in the epigastric area, constant, aching, severe, nonradiating, not aggravated or alleviated by any known factors.  Patient has nausea and vomited few times with nonbilious nonbloody vomiting.  No diarrhea.  No fever or chills.  Denies chest pain, cough, SOB.  No symptoms of UTI. Patient states that he drinks 1 beer occasionally, last drinking was 3 weeks ago.     Data reviewed independently and ED Course: pt was found to have lipase 933, triglyceride 85, Mg 1.4, liver function (ALP 133, AST 17, ALT 6, total bilirubin 1.3), WBC 7.4, GFR>  60, sodium 126, negative UA.  Temperature 99.1, blood pressure 112/68, heart rate 90-1 100s, RR 20, oxygen saturation 96% on room air.  Patient is placed in telemetry bed of observation.     Assessment and Plan:    Acute on chronic pancreatitis (HCC): Lipase 933.  Triglyceride level normal 85.  Total bilirubin is slightly elevated 1.3. RUQ-US  showed moderate volume layering biliary sludge in the gallbladder. MRCP reviewed did not show obvious biliary system obstruction.  Did show some fluid-filled cyst Diet has been advanced Patient has been advised to avoid Ozempic use as well as alcohol none Domino pain much better     Abnormal findings of CT abdomen/pelvis: CT scan showed stable area of soft tissue attenuation extending from the region of the ampulla into the lumen of the proximal duodenum. Correlation with endoscopy is recommended to exclude the presence of a small neoplasm. MRCP did not show any pancreatic mass   Type 2 diabetes mellitus with peripheral neuropathy [facility]): Recent A1c 6.7, well-controlled.   Continue insulin  therapy Monitor glucose closely   HTN (hypertension): Patient is not taking medications currently.  Blood pressure 112/68. -IV hydralazine  as needed   PAF (paroxysmal atrial fibrillation) (HCC): Patient does not take anticoagulants.  Heart rate 90-100s. -As needed metoprolol  2.5 mg every 2 hour for heart rate> 125   Hyponatremia: Sodium 126.  Mental status normal. Likely due to poor oral intake and dehydration. Hyponatremia resolved  Hypomagnesemia:  On presentation magnesium  1.4.  Potassium 4.7 and phosphorus 3.3.  Hypomagnesemia resolved   COPD (chronic obstructive pulmonary disease) (HCC):  Stable -As needed albuterol  and Mucinex    S/P AKA (above knee amputation) bilateral (HCC) -Precaution   Gastritis -Protonix    Tobacco abuse -Nicotine  patch - Did counseling about importance of quitting smoking   Alcohol use: Patient states that her last drink  was 3 weeks ago.  Currently no signs of withdrawal. -Observe closely for any signs of withdrawal Continue folic acid  and vitamin B1   Obesity, Class III, BMI 40.5 Counseled on weight loss when medically stable   DVT ppx:  sQ Lovenox    Code Status: Full code    Consultants: None Procedures performed: None Disposition: Home Diet recommendation:  Cardiac and Carb modified diet DISCHARGE MEDICATION: Allergies as of 10/16/2024   No Known Allergies      Medication List     STOP taking these medications    Ozempic (0.25 or 0.5 MG/DOSE) 2 MG/3ML Sopn Generic drug: Semaglutide(0.25 or 0.5MG /DOS)       TAKE these medications    Acetaminophen  Extra Strength 500 MG Tabs Take 500 mg by mouth daily.   atorvastatin  40 MG tablet Commonly known as: LIPITOR Take 40 mg by mouth daily.   folic acid  1 MG tablet Commonly known as: FOLVITE  Take 1 tablet (1 mg total) by mouth daily.   Lantus  SoloStar 100 UNIT/ML Solostar Pen Generic drug: insulin  glargine Inject 10 Units into the skin at bedtime.   methocarbamol 500 MG tablet Commonly known as: ROBAXIN Take 500 mg by mouth.   Oxycodone  HCl 10 MG Tabs Take 10 mg by mouth every 6 (six) hours as needed.   pantoprazole  40 MG tablet Commonly known as: PROTONIX  Take 1 tablet (40 mg total) by mouth daily at 12 noon.   pregabalin 100 MG capsule Commonly known as: LYRICA Take 100 mg by mouth 2 (two) times daily.   Senna-Plus 8.6-50 MG tablet Generic drug: senna-docusate Take 1 tablet by mouth at bedtime as needed for mild constipation.   sodium chloride  1 g tablet Take 1 tablet (1 g total) by mouth 2 (two) times daily with a meal.   thiamine  100 MG tablet Commonly known as: VITAMIN B1 Take 1 tablet (100 mg total) by mouth daily.        Discharge Exam: Filed Weights   10/12/24 1113 10/12/24 1642 10/16/24 0500  Weight: 68 kg 68 kg 68 kg   General: Not in acute distress Heme: No neck lymph node enlargement. Cardiac:  S1/S2, RRR, No murmurs, No gallops or rubs. Respiratory: No rales, wheezing, rhonchi or rubs. GI: Soft abdominal tenderness improved GU: No hematuria Ext: No pitting leg edema bilaterally. S/p of bilateral AKA Musculoskeletal: No joint deformities, No joint redness or warmth, no limitation of ROM in spin. Skin: No rashes.  Neuro: Alert, oriented X3, cranial nerves II-XII grossly intact.  Psych: Patient is not psychotic, no suicidal or hemocidal ideation.  Condition at discharge: good  The results of significant diagnostics from this hospitalization (including imaging, microbiology, ancillary and laboratory) are listed below for reference.   Imaging Studies: MR ABDOMEN MRCP WO CONTRAST Result Date: 10/13/2024 CLINICAL DATA:  pancreatitis, elevated tbili, sludge seen on RUQ US , eval for choledocholithiasis. EXAM: MRI ABDOMEN WITHOUT CONTRAST  (INCLUDING MRCP) TECHNIQUE: Multiplanar multisequence MR imaging of the abdomen was performed. Heavily T2-weighted images of the biliary and pancreatic ducts were obtained, and three-dimensional MRCP images were rendered by post processing. COMPARISON:  Ultrasound abdomen and CT scan abdomen and pelvis from 10/12/2024. FINDINGS: Lower chest: Unremarkable MR appearance to the lung bases. No pleural effusion.  No pericardial effusion. Normal heart size. Hepatobiliary: The liver is normal in size. Noncirrhotic configuration. No focal liver lesion. No intra or extrahepatic bile duct dilation. No choledocholithiasis seen. Physiologically distended gallbladder. Normal wall thickness. No pericholecystic inflammatory changes. No imaging evidence of acute cholecystitis. Pancreas: There is decreased T1 signal intensity of the pancreas which exhibit mild diffuse interstitial edema, compatible with acute pancreatitis. Moderate amount of edema predominantly surrounds the pancreatic head, body and proximal tail. There are multiple peripancreatic, fairly T2 hyperintense, fluid  collections which does not exhibit well-defined wall. Largest such collection is linear configuration anterior to the pancreatic body and proximal tail measuring up to 9 mm in anteroposterior dimension and up to 7.4 cm and transverse dimension. There are additional several smaller acute peripancreatic collections as well, few of which are intercommunicating. There is also small to moderate amount of non walled-off fluid in the left upper quadrant surrounding the splenic flexure of colon. There are multiple subcentimeter sized T2 hypointense foci throughout the pancreas which corresponds to dystrophic calcifications seen on the prior CT scan and suggest sequela of chronic pancreatitis. The main pancreatic duct is dilated and tortuous and exhibit multiple dilated pancreatic side branches, also compatible with sequela of chronic pancreatitis. No suspicious pancreatic lesion. Spleen:  Within normal limits in size and appearance. No focal mass. Adrenals/Urinary Tract: Unremarkable adrenal glands. No hydroureteronephrosis. No suspicious renal mass. There are scattered sub 5 mm simple cysts in the left kidney. Stomach/Bowel: There is a tiny sliding hiatal hernia. Visualized portions within the abdomen are unremarkable. No disproportionate dilation of bowel loops. Vascular/Lymphatic: No pathologically enlarged lymph nodes identified. No abdominal aortic aneurysm demonstrated. There is trace ascites in the perihepatic/perisplenic region as well as along the left paracolic gutter, likely reactive. Other:  None. Musculoskeletal: No suspicious bone lesions identified. IMPRESSION: 1. Acute on chronic, interstitial pancreatitis with multiple acute peripancreatic fluid collections, as described in detail above. No suspicious pancreatic lesion. 2. No intra or extrahepatic bile duct dilation. No choledocholithiasis. Unremarkable gallbladder. 3. Multiple other nonacute observations, as described above. Electronically Signed   By:  Ree Molt M.D.   On: 10/13/2024 08:44   MR 3D Recon At Scanner Result Date: 10/13/2024 CLINICAL DATA:  pancreatitis, elevated tbili, sludge seen on RUQ US , eval for choledocholithiasis. EXAM: MRI ABDOMEN WITHOUT CONTRAST  (INCLUDING MRCP) TECHNIQUE: Multiplanar multisequence MR imaging of the abdomen was performed. Heavily T2-weighted images of the biliary and pancreatic ducts were obtained, and three-dimensional MRCP images were rendered by post processing. COMPARISON:  Ultrasound abdomen and CT scan abdomen and pelvis from 10/12/2024. FINDINGS: Lower chest: Unremarkable MR appearance to the lung bases. No pleural effusion. No pericardial effusion. Normal heart size. Hepatobiliary: The liver is normal in size. Noncirrhotic configuration. No focal liver lesion. No intra or extrahepatic bile duct dilation. No choledocholithiasis seen. Physiologically distended gallbladder. Normal wall thickness. No pericholecystic inflammatory changes. No imaging evidence of acute cholecystitis. Pancreas: There is decreased T1 signal intensity of the pancreas which exhibit mild diffuse interstitial edema, compatible with acute pancreatitis. Moderate amount of edema predominantly surrounds the pancreatic head, body and proximal tail. There are multiple peripancreatic, fairly T2 hyperintense, fluid collections which does not exhibit well-defined wall. Largest such collection is linear configuration anterior to the pancreatic body and proximal tail measuring up to 9 mm in anteroposterior dimension and up to 7.4 cm and transverse dimension. There are additional several smaller acute peripancreatic collections as well, few of which are intercommunicating. There is also small to moderate amount of  non walled-off fluid in the left upper quadrant surrounding the splenic flexure of colon. There are multiple subcentimeter sized T2 hypointense foci throughout the pancreas which corresponds to dystrophic calcifications seen on the prior  CT scan and suggest sequela of chronic pancreatitis. The main pancreatic duct is dilated and tortuous and exhibit multiple dilated pancreatic side branches, also compatible with sequela of chronic pancreatitis. No suspicious pancreatic lesion. Spleen:  Within normal limits in size and appearance. No focal mass. Adrenals/Urinary Tract: Unremarkable adrenal glands. No hydroureteronephrosis. No suspicious renal mass. There are scattered sub 5 mm simple cysts in the left kidney. Stomach/Bowel: There is a tiny sliding hiatal hernia. Visualized portions within the abdomen are unremarkable. No disproportionate dilation of bowel loops. Vascular/Lymphatic: No pathologically enlarged lymph nodes identified. No abdominal aortic aneurysm demonstrated. There is trace ascites in the perihepatic/perisplenic region as well as along the left paracolic gutter, likely reactive. Other:  None. Musculoskeletal: No suspicious bone lesions identified. IMPRESSION: 1. Acute on chronic, interstitial pancreatitis with multiple acute peripancreatic fluid collections, as described in detail above. No suspicious pancreatic lesion. 2. No intra or extrahepatic bile duct dilation. No choledocholithiasis. Unremarkable gallbladder. 3. Multiple other nonacute observations, as described above. Electronically Signed   By: Ree Molt M.D.   On: 10/13/2024 08:44   CT ABDOMEN PELVIS W CONTRAST Result Date: 10/12/2024 CLINICAL DATA:  History of bilateral above the knee amputation, presenting with diffuse abdominal pain. EXAM: CT ABDOMEN AND PELVIS WITH CONTRAST TECHNIQUE: Multidetector CT imaging of the abdomen and pelvis was performed using the standard protocol following bolus administration of intravenous contrast. RADIATION DOSE REDUCTION: This exam was performed according to the departmental dose-optimization program which includes automated exposure control, adjustment of the mA and/or kV according to patient size and/or use of iterative  reconstruction technique. CONTRAST:  OMNIPAQUE  IOHEXOL  300 MG/ML  SOLN COMPARISON:  September 10, 2024 and October 29, 2023 FINDINGS: Lower chest: No acute abnormality. Hepatobiliary: No focal liver abnormality is seen. No gallstones, gallbladder wall thickening, or biliary dilatation. Pancreas: There is moderate to marked severity peripancreatic inflammatory fat stranding with a mild-to-moderate amount of predominantly anterior peripancreatic fluid. Numerous small parenchymal calcifications are seen throughout the pancreas there is no evidence of pancreatic ductal dilatation. A stable 1.6 cm x 1.5 cm x 1.4 cm area of soft tissue attenuation is seen extending from the region of the ampulla into the lumen of the proximal duodenum (axial CT image 20, CT series 2/coronal reformatted image 45, CT series 6). Spleen: Normal in size without focal abnormality. Adrenals/Urinary Tract: Adrenal glands are unremarkable. Kidneys are normal, without renal calculi, focal lesion, or hydronephrosis. Bladder is unremarkable. Stomach/Bowel: Moderate to marked severity hypodense gastric wall thickening is seen along the posterior aspect of the gastric body and the anterior and posterior aspect of the gastric antrum. The appendix is normal. There is no evidence of bowel distension. Vascular/Lymphatic: Aortic atherosclerosis. 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 The junction of the portal vein, splenic vein and superior mesenteric vein is poorly visualized. This is present on the prior study. No enlarged abdominal or pelvic lymph nodes. Reproductive: The prostate gland is mildly enlarged. Other: No abdominal wall hernia or abnormality. A small amount of posterior pelvic free fluid is seen. Musculoskeletal: No acute or significant osseous findings. IMPRESSION: 1. Moderate to marked severity acute on chronic pancreatitis. 2. Stable area of soft tissue attenuation  extending from the region of the ampulla  into the lumen of the proximal duodenum. Correlation with endoscopy is recommended to exclude the presence of a small neoplasm. 3. Moderate to marked severity gastritis. 4. Small amount of posterior pelvic free fluid. 5. Aortic atherosclerosis. Electronically Signed   By: Suzen Dials M.D.   On: 10/12/2024 19:12   US  ABDOMEN LIMITED RUQ (LIVER/GB) Result Date: 10/12/2024 EXAM: Right Upper Quadrant Abdominal Ultrasound 10/12/2024 05:25:19 PM TECHNIQUE: Real-time ultrasonography of the right upper quadrant of the abdomen was performed. COMPARISON: None available. CLINICAL HISTORY: RUQ pain. FINDINGS: LIVER: Normal echogenicity. No intrahepatic biliary ductal dilatation. No evidence of mass. Hepatopetal flow in the portal vein. BILIARY SYSTEM: No sonographic Murphy sign. Moderate volume biliary sludge layering in the gallbladder. No cholecystolithiasis or changes of acute cholecystitis. No pericholecystic fluid or wall thickening. Common bile duct is within normal limits measuring 3.6 mm. OTHER: No right upper quadrant ascites. IMPRESSION: 1. Moderate volume layering biliary sludge in the gallbladder. No cholecystolithiasis or sonographic changes of acute cholecystitis. Electronically signed by: Rogelia Myers MD 10/12/2024 06:10 PM EST RP Workstation: [id number]    Microbiology: Results for orders placed or performed during the hospital encounter of 08/27/22  MRSA Next Gen by PCR, Nasal     Status: None   Collection Time: 08/28/22  6:45 PM   Specimen: Nasal Mucosa; Nasal Swab  Result Value Ref Range Status   MRSA by PCR Next Gen NOT DETECTED NOT DETECTED Final    Comment: (NOTE) The GeneXpert MRSA Assay (FDA approved for NASAL specimens only), is one component of a comprehensive MRSA colonization surveillance program. It is not intended to diagnose MRSA infection nor to guide or monitor treatment for MRSA infections. Test performance is not FDA approved  in patients less than 66 years old. Performed at [facility], [address] [place], [place] [address]     Labs: CBC: Recent Labs  Lab 10/12/24 1100 10/13/24 0445 10/14/24 0434 10/16/24 0510  WBC 7.4 6.5 6.9 7.2  NEUTROABS  --   --  3.3 4.1  HGB 14.9 13.0 11.6* 11.7*  HCT 43.7 38.9* 34.3* 34.0*  MCV 84.0 84.9 84.9 84.2  PLT 219 191 180 222   Basic Metabolic Panel: Recent Labs  Lab 10/12/24 1100 10/13/24 0445 10/13/24 0916 10/13/24 1227 10/13/24 1638 10/14/24 0434 10/16/24 0510  NA 126*   < > 130* 131* 130* 132* 136  K 4.7   < > 4.3 4.5 4.4 4.2 3.9  CL 89*   < > 96* 98 100 101 105  CO2 23   < > 19* 21* 19* 22 22  GLUCOSE 137*   < > 101* 122* 232* 124* 149*  BUN 13   < > 14 14 13 9  <5*  CREATININE 1.01   < > 0.89 0.88 0.80 0.67 0.58*  CALCIUM  8.9   < > 8.4* 8.5* 8.0* 8.0* 7.7*  MG 1.4*  --   --   --   --   --  1.2*  PHOS 3.3  --   --   --   --   --  2.9   < > = values in this interval not displayed.   Liver Function Tests: Recent Labs  Lab 10/12/24 1100 10/13/24 0445 10/16/24 0510  AST 17 13* 29  ALT 6 5 10   ALKPHOS 113 91 89  BILITOT 1.3* 0.9 0.6  PROT 7.6 6.6 5.5*  ALBUMIN  3.5 3.1* 2.5*   CBG: Recent Labs  Lab 10/15/24 0748 10/15/24 1152 10/15/24 1830 10/15/24 2138 10/16/24  0801  GLUCAP 121* 144* 70 95 149*    Discharge time spent:  .  Signed: Drue ONEIDA Potter, MD [facility] 10/16/2024 "
